# Patient Record
Sex: Female | Born: 1990 | Race: Black or African American | Hispanic: No | Marital: Single | Smoking: Current every day smoker
Health system: Southern US, Community
[De-identification: ages and names within clinical notes are randomized; demographics above are authoritative.]

## PROBLEM LIST (undated history)

## (undated) ENCOUNTER — Inpatient Hospital Stay (HOSPITAL_COMMUNITY): Payer: Self-pay

## (undated) DIAGNOSIS — B999 Unspecified infectious disease: Secondary | ICD-10-CM

## (undated) DIAGNOSIS — R519 Headache, unspecified: Secondary | ICD-10-CM

## (undated) DIAGNOSIS — R87629 Unspecified abnormal cytological findings in specimens from vagina: Secondary | ICD-10-CM

## (undated) DIAGNOSIS — A749 Chlamydial infection, unspecified: Secondary | ICD-10-CM

## (undated) DIAGNOSIS — B977 Papillomavirus as the cause of diseases classified elsewhere: Secondary | ICD-10-CM

## (undated) DIAGNOSIS — O139 Gestational [pregnancy-induced] hypertension without significant proteinuria, unspecified trimester: Secondary | ICD-10-CM

## (undated) DIAGNOSIS — R51 Headache: Secondary | ICD-10-CM

## (undated) DIAGNOSIS — F1290 Cannabis use, unspecified, uncomplicated: Secondary | ICD-10-CM

## (undated) DIAGNOSIS — F17200 Nicotine dependence, unspecified, uncomplicated: Secondary | ICD-10-CM

## (undated) DIAGNOSIS — A599 Trichomoniasis, unspecified: Secondary | ICD-10-CM

## (undated) DIAGNOSIS — N83209 Unspecified ovarian cyst, unspecified side: Secondary | ICD-10-CM

## (undated) DIAGNOSIS — B009 Herpesviral infection, unspecified: Secondary | ICD-10-CM

## (undated) HISTORY — PX: NO PAST SURGERIES: SHX2092

## (undated) HISTORY — PX: MOUTH SURGERY: SHX715

---
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
## 2009-02-01 ENCOUNTER — Emergency Department (HOSPITAL_COMMUNITY): Admission: EM | Admit: 2009-02-01 | Discharge: 2009-02-01 | Payer: Self-pay | Admitting: Emergency Medicine

## 2009-02-03 ENCOUNTER — Emergency Department (HOSPITAL_COMMUNITY): Admission: EM | Admit: 2009-02-03 | Discharge: 2009-02-03 | Payer: Self-pay | Admitting: Emergency Medicine

## 2009-04-07 ENCOUNTER — Emergency Department (HOSPITAL_COMMUNITY): Admission: EM | Admit: 2009-04-07 | Discharge: 2009-04-07 | Payer: Self-pay | Admitting: Family Medicine

## 2009-12-17 ENCOUNTER — Emergency Department (HOSPITAL_COMMUNITY): Admission: EM | Admit: 2009-12-17 | Discharge: 2009-12-17 | Payer: Self-pay | Admitting: Family Medicine

## 2010-07-17 ENCOUNTER — Inpatient Hospital Stay (INDEPENDENT_AMBULATORY_CARE_PROVIDER_SITE_OTHER)
Admission: RE | Admit: 2010-07-17 | Discharge: 2010-07-17 | Disposition: A | Discharge status: Home / Self Care | Payer: Self-pay | Source: Ambulatory Visit | Attending: Family Medicine | Admitting: Family Medicine

## 2010-07-17 DIAGNOSIS — A6000 Herpesviral infection of urogenital system, unspecified: Secondary | ICD-10-CM

## 2010-07-17 LAB — POCT URINALYSIS DIP (DEVICE)
Bilirubin Urine: NEGATIVE
Glucose, UA: NEGATIVE mg/dL
Ketones, ur: NEGATIVE mg/dL
Nitrite: NEGATIVE
Protein, ur: NEGATIVE mg/dL
Specific Gravity, Urine: 1.02 (ref 1.005–1.030)
Urobilinogen, UA: 0.2 mg/dL (ref 0.0–1.0)
pH: 7 (ref 5.0–8.0)

## 2010-07-17 LAB — POCT PREGNANCY, URINE: Preg Test, Ur: NEGATIVE

## 2010-07-18 LAB — STREP A DNA PROBE: Group A Strep Probe: NEGATIVE

## 2010-07-18 LAB — POCT RAPID STREP A (OFFICE): Streptococcus, Group A Screen (Direct): NEGATIVE

## 2010-07-19 ENCOUNTER — Inpatient Hospital Stay (HOSPITAL_COMMUNITY)
Admission: AD | Admit: 2010-07-19 | Discharge: 2010-07-19 | Disposition: A | Discharge status: Home / Self Care | Payer: Self-pay | Source: Ambulatory Visit | Attending: Obstetrics & Gynecology | Admitting: Obstetrics & Gynecology

## 2010-07-19 DIAGNOSIS — A6000 Herpesviral infection of urogenital system, unspecified: Secondary | ICD-10-CM

## 2010-07-19 DIAGNOSIS — N760 Acute vaginitis: Secondary | ICD-10-CM

## 2010-07-19 DIAGNOSIS — A499 Bacterial infection, unspecified: Secondary | ICD-10-CM | POA: Insufficient documentation

## 2010-07-19 DIAGNOSIS — N949 Unspecified condition associated with female genital organs and menstrual cycle: Secondary | ICD-10-CM | POA: Insufficient documentation

## 2010-07-19 DIAGNOSIS — B9689 Other specified bacterial agents as the cause of diseases classified elsewhere: Secondary | ICD-10-CM | POA: Insufficient documentation

## 2010-07-19 LAB — WET PREP, GENITAL
Trich, Wet Prep: NONE SEEN
Yeast Wet Prep HPF POC: NONE SEEN

## 2010-07-19 LAB — RPR: RPR Ser Ql: NONREACTIVE

## 2010-07-19 LAB — HEPATITIS B SURFACE ANTIGEN: Hepatitis B Surface Ag: NEGATIVE

## 2010-07-19 LAB — HIV ANTIBODY (ROUTINE TESTING W REFLEX): HIV: NONREACTIVE

## 2010-07-20 LAB — GC/CHLAMYDIA PROBE AMP, GENITAL
Chlamydia, DNA Probe: NEGATIVE
GC Probe Amp, Genital: NEGATIVE

## 2010-07-20 LAB — HSV 1 ANTIBODY, IGG: HSV 1 Glycoprotein G Ab, IgG: 0.13 IV

## 2010-07-20 LAB — HSV 2 ANTIBODY, IGG: HSV 2 Glycoprotein G Ab, IgG: 0.24 IV

## 2010-07-21 ENCOUNTER — Emergency Department (HOSPITAL_COMMUNITY)
Admission: EM | Admit: 2010-07-21 | Discharge: 2010-07-21 | Disposition: A | Discharge status: Home / Self Care | Payer: Self-pay | Attending: Emergency Medicine | Admitting: Emergency Medicine

## 2010-07-21 DIAGNOSIS — L02519 Cutaneous abscess of unspecified hand: Secondary | ICD-10-CM | POA: Insufficient documentation

## 2010-07-21 DIAGNOSIS — L03019 Cellulitis of unspecified finger: Secondary | ICD-10-CM | POA: Insufficient documentation

## 2010-07-21 DIAGNOSIS — Z79899 Other long term (current) drug therapy: Secondary | ICD-10-CM | POA: Insufficient documentation

## 2010-07-21 DIAGNOSIS — M79609 Pain in unspecified limb: Secondary | ICD-10-CM | POA: Insufficient documentation

## 2010-07-21 LAB — WET PREP, GENITAL: Clue Cells Wet Prep HPF POC: NONE SEEN

## 2010-07-21 LAB — POCT URINALYSIS DIP (DEVICE)
Glucose, UA: NEGATIVE mg/dL
Ketones, ur: NEGATIVE mg/dL
Nitrite: NEGATIVE
Protein, ur: NEGATIVE mg/dL
Specific Gravity, Urine: 1.025 (ref 1.005–1.030)
Urobilinogen, UA: 0.2 mg/dL (ref 0.0–1.0)
pH: 5 (ref 5.0–8.0)

## 2010-07-21 LAB — HERPES SIMPLEX VIRUS CULTURE: Culture: DETECTED

## 2010-07-21 LAB — GC/CHLAMYDIA PROBE AMP, GENITAL
Chlamydia, DNA Probe: NEGATIVE
GC Probe Amp, Genital: NEGATIVE

## 2010-07-21 LAB — POCT PREGNANCY, URINE: Preg Test, Ur: NEGATIVE

## 2010-07-27 LAB — POCT URINALYSIS DIP (DEVICE)
Bilirubin Urine: NEGATIVE
Glucose, UA: NEGATIVE mg/dL
Hgb urine dipstick: NEGATIVE
Ketones, ur: NEGATIVE mg/dL
Nitrite: NEGATIVE
Protein, ur: NEGATIVE mg/dL
Specific Gravity, Urine: 1.025 (ref 1.005–1.030)
Urobilinogen, UA: 0.2 mg/dL (ref 0.0–1.0)
pH: 5.5 (ref 5.0–8.0)

## 2010-07-27 LAB — GC/CHLAMYDIA PROBE AMP, GENITAL
Chlamydia, DNA Probe: NEGATIVE
GC Probe Amp, Genital: NEGATIVE

## 2010-07-27 LAB — WET PREP, GENITAL: Trich, Wet Prep: NONE SEEN

## 2010-07-27 LAB — POCT PREGNANCY, URINE: Preg Test, Ur: NEGATIVE

## 2010-08-29 ENCOUNTER — Inpatient Hospital Stay (INDEPENDENT_AMBULATORY_CARE_PROVIDER_SITE_OTHER)
Admission: RE | Admit: 2010-08-29 | Discharge: 2010-08-29 | Disposition: A | Discharge status: Home / Self Care | Payer: Self-pay | Source: Ambulatory Visit | Attending: Family Medicine | Admitting: Family Medicine

## 2010-08-29 DIAGNOSIS — A499 Bacterial infection, unspecified: Secondary | ICD-10-CM

## 2010-08-29 DIAGNOSIS — N760 Acute vaginitis: Secondary | ICD-10-CM

## 2010-08-29 DIAGNOSIS — B9689 Other specified bacterial agents as the cause of diseases classified elsewhere: Secondary | ICD-10-CM

## 2010-08-29 DIAGNOSIS — K602 Anal fissure, unspecified: Secondary | ICD-10-CM

## 2010-08-29 LAB — POCT URINALYSIS DIP (DEVICE)
Bilirubin Urine: NEGATIVE
Glucose, UA: NEGATIVE mg/dL
Ketones, ur: NEGATIVE mg/dL
Nitrite: NEGATIVE
Protein, ur: NEGATIVE mg/dL
Specific Gravity, Urine: 1.025 (ref 1.005–1.030)
Urobilinogen, UA: 0.2 mg/dL (ref 0.0–1.0)
pH: 5 (ref 5.0–8.0)

## 2010-08-29 LAB — POCT PREGNANCY, URINE: Preg Test, Ur: NEGATIVE

## 2010-09-02 NOTE — Group Therapy Note (Signed)
NAMEJACQUI, HEADEN NO.:  [id number]   MEDICAL RECORD NO.:  [id number]          PATIENT TYPE:  WOC   LOCATION:  [facility]                   FACILITY:  [facility]   PHYSICIAN:  Dorthula Perfect, MD     DATE OF BIRTH:  1991/01/19   DATE OF SERVICE:                                    CLINIC NOTE   Joanne Huff is a 20 year old black female, nulliparous, last menstrual  period September 5, comes because of a history of irregular menstrual  periods and the possibility of polycystic ovarian disease. She had menarche  at age 14. Beginning at about age 22, she stopped having periods and  thereafter had one period about every six months or so. Last month, [facility] the  [facility] department, she was started on Yaz which resulted in her having her  last period listed above. She is currently finishing up a second package.  Also, sometime in the past somebody prescribed the patch for her period,  she did not like that.   She is not sexually active.   REVIEW OF SYSTEMS:  No cardiovascular or respiratory complaints. She  recently had a little bit bright red blood having a bowel movement. It has  not happened again. She has no urinary symptoms.   FAMILY HISTORY:  Positive, in that she had a grandfather that has diabetes.  No immediate family members have diabetes.   PHYSICAL EXAMINATION:  VITAL SIGNS: Height 5'7, weight 248, blood pressure  129/85.  She has a slight amount of increased hair on her upper neck area under her  chin. She occasionally shaves this area.  BREASTS: Normal.  ABDOMEN: Obese. No masses were felt.  PELVIC: External genitalia and BUS glands are normal. Clitoris is of normal  size. Vaginal vault  is negative. Cervix is nulliparous, epithelized and  normal. Uterus is normal size and shape. Adnexal structures are normal. The  ovaries are difficult to feel because of her body habitus but I do not  detect that they are enlarged.  RECTAL: Negative.   IMPRESSION:   Irregular menstrual cycle, possible polycystic ovary syndrome.   DISPOSITION:  1. I have recommended that she remain on the Yaz, to regulate her periods.  2. Fasting blood sugar with two hour glucose tolerance test.  3. I have discussed a little bit with her the need to lose weight and gave      her some recommendations as to what to eat and not eat.          ______________________________  Dorthula Perfect, MD    ER/MEDQ  D:  01/12/2006  T:  01/14/2006  Job:  [id number]

## 2010-11-04 ENCOUNTER — Inpatient Hospital Stay (INDEPENDENT_AMBULATORY_CARE_PROVIDER_SITE_OTHER)
Admission: RE | Admit: 2010-11-04 | Discharge: 2010-11-04 | Disposition: A | Discharge status: Home / Self Care | Payer: Self-pay | Source: Ambulatory Visit | Attending: Family Medicine | Admitting: Family Medicine

## 2010-11-04 DIAGNOSIS — A6000 Herpesviral infection of urogenital system, unspecified: Secondary | ICD-10-CM

## 2010-11-04 LAB — POCT URINALYSIS DIP (DEVICE)
Bilirubin Urine: NEGATIVE
Glucose, UA: NEGATIVE mg/dL
Hgb urine dipstick: NEGATIVE
Ketones, ur: NEGATIVE mg/dL
Nitrite: NEGATIVE
Protein, ur: NEGATIVE mg/dL
Specific Gravity, Urine: >=1.03 (ref 1.005–1.030)
Urobilinogen, UA: 0.2 mg/dL (ref 0.0–1.0)
pH: 5.5 (ref 5.0–8.0)

## 2010-11-04 LAB — POCT PREGNANCY, URINE: Preg Test, Ur: NEGATIVE

## 2011-01-05 LAB — URINALYSIS, ROUTINE W REFLEX MICROSCOPIC
Bilirubin Urine: NEGATIVE
Glucose, UA: NEGATIVE
Hgb urine dipstick: NEGATIVE
Ketones, ur: NEGATIVE
Nitrite: NEGATIVE
Protein, ur: NEGATIVE
Specific Gravity, Urine: 1.02
Urobilinogen, UA: 0.2
pH: 7

## 2011-01-05 LAB — WET PREP, GENITAL
Clue Cells Wet Prep HPF POC: NONE SEEN
Trich, Wet Prep: NONE SEEN
Yeast Wet Prep HPF POC: NONE SEEN

## 2011-01-05 LAB — GC/CHLAMYDIA PROBE AMP, GENITAL
Chlamydia, DNA Probe: NEGATIVE
GC Probe Amp, Genital: NEGATIVE

## 2011-01-05 LAB — POCT PREGNANCY, URINE
Operator id: 280671
Preg Test, Ur: NEGATIVE

## 2011-01-10 LAB — RAPID STREP SCREEN (MED CTR MEBANE ONLY): Streptococcus, Group A Screen (Direct): NEGATIVE

## 2011-01-10 LAB — POCT PREGNANCY, URINE
Operator id: 202651
Preg Test, Ur: NEGATIVE

## 2011-01-13 LAB — URINALYSIS, ROUTINE W REFLEX MICROSCOPIC
Bilirubin Urine: NEGATIVE
Glucose, UA: NEGATIVE
Ketones, ur: NEGATIVE
Nitrite: NEGATIVE
Protein, ur: NEGATIVE
Specific Gravity, Urine: 1.025
Urobilinogen, UA: 0.2
pH: 6

## 2011-01-13 LAB — CBC
HCT: 39
Hemoglobin: 12.8
MCHC: 32.7
MCV: 86.5
Platelets: 331
RBC: 4.51
RDW: 15.7 — ABNORMAL HIGH
WBC: 6.1

## 2011-01-13 LAB — POCT PREGNANCY, URINE
Operator id: 220991
Preg Test, Ur: NEGATIVE

## 2011-01-13 LAB — URINE MICROSCOPIC-ADD ON

## 2011-01-13 LAB — GC/CHLAMYDIA PROBE AMP, GENITAL
Chlamydia, DNA Probe: NEGATIVE
GC Probe Amp, Genital: POSITIVE — AB

## 2011-01-13 LAB — WET PREP, GENITAL
Clue Cells Wet Prep HPF POC: NONE SEEN
Trich, Wet Prep: NONE SEEN
Yeast Wet Prep HPF POC: NONE SEEN

## 2011-01-16 LAB — POCT URINALYSIS DIP (DEVICE)
Glucose, UA: NEGATIVE
Hgb urine dipstick: NEGATIVE
Ketones, ur: NEGATIVE
Nitrite: NEGATIVE
Operator id: 239701
Protein, ur: NEGATIVE
Specific Gravity, Urine: 1.015
Urobilinogen, UA: 1
pH: 5.5

## 2011-01-16 LAB — WET PREP, GENITAL
Trich, Wet Prep: NONE SEEN
Yeast Wet Prep HPF POC: NONE SEEN

## 2011-01-16 LAB — POCT PREGNANCY, URINE: Preg Test, Ur: NEGATIVE

## 2011-01-16 LAB — GC/CHLAMYDIA PROBE AMP, GENITAL
Chlamydia, DNA Probe: NEGATIVE
GC Probe Amp, Genital: NEGATIVE

## 2011-01-20 LAB — RAPID STREP SCREEN (MED CTR MEBANE ONLY): Streptococcus, Group A Screen (Direct): POSITIVE — AB

## 2011-01-25 LAB — URINALYSIS, ROUTINE W REFLEX MICROSCOPIC
Bilirubin Urine: NEGATIVE
Glucose, UA: NEGATIVE
Hgb urine dipstick: NEGATIVE
Ketones, ur: NEGATIVE
Nitrite: NEGATIVE
Protein, ur: NEGATIVE
Specific Gravity, Urine: 1.025
Urobilinogen, UA: 0.2
pH: 6.5

## 2011-01-25 LAB — POCT URINALYSIS DIP (DEVICE)
Bilirubin Urine: NEGATIVE
Glucose, UA: NEGATIVE
Hgb urine dipstick: NEGATIVE
Ketones, ur: NEGATIVE
Nitrite: NEGATIVE
Operator id: 247071
Protein, ur: NEGATIVE
Specific Gravity, Urine: 1.02
Urobilinogen, UA: 1
pH: 7

## 2011-01-25 LAB — POCT PREGNANCY, URINE
Operator id: 220991
Operator id: 247071
Preg Test, Ur: NEGATIVE
Preg Test, Ur: NEGATIVE

## 2011-01-25 LAB — GC/CHLAMYDIA PROBE AMP, GENITAL
Chlamydia, DNA Probe: NEGATIVE
GC Probe Amp, Genital: NEGATIVE

## 2011-01-25 LAB — WET PREP, GENITAL
Clue Cells Wet Prep HPF POC: NONE SEEN
Trich, Wet Prep: NONE SEEN
Yeast Wet Prep HPF POC: NONE SEEN

## 2011-02-20 ENCOUNTER — Encounter: Payer: Self-pay | Admitting: *Deleted

## 2011-02-20 ENCOUNTER — Emergency Department (HOSPITAL_COMMUNITY)
Admission: EM | Admit: 2011-02-20 | Discharge: 2011-02-21 | Discharge status: Against Medical Advice | Payer: Self-pay | Attending: Emergency Medicine | Admitting: Emergency Medicine

## 2011-02-20 DIAGNOSIS — R070 Pain in throat: Secondary | ICD-10-CM | POA: Insufficient documentation

## 2011-02-20 NOTE — ED Notes (Signed)
sorethroat for 4 days and some lt ear pain also

## 2011-02-21 NOTE — ED Notes (Signed)
Called pt, no answer.

## 2011-03-08 ENCOUNTER — Other Ambulatory Visit: Payer: Self-pay | Admitting: Family Medicine

## 2011-03-08 DIAGNOSIS — N6310 Unspecified lump in the right breast, unspecified quadrant: Secondary | ICD-10-CM

## 2011-03-15 ENCOUNTER — Other Ambulatory Visit: Payer: Self-pay

## 2011-05-10 ENCOUNTER — Encounter: Payer: Self-pay | Admitting: Obstetrics & Gynecology

## 2012-03-06 ENCOUNTER — Encounter (HOSPITAL_COMMUNITY): Payer: Self-pay

## 2012-03-06 ENCOUNTER — Inpatient Hospital Stay (HOSPITAL_COMMUNITY)
Admission: AD | Admit: 2012-03-06 | Discharge: 2012-03-06 | Disposition: A | Discharge status: Home / Self Care | Payer: Self-pay | Source: Ambulatory Visit | Attending: Obstetrics & Gynecology | Admitting: Obstetrics & Gynecology

## 2012-03-06 DIAGNOSIS — N912 Amenorrhea, unspecified: Secondary | ICD-10-CM | POA: Insufficient documentation

## 2012-03-06 DIAGNOSIS — Z3202 Encounter for pregnancy test, result negative: Secondary | ICD-10-CM | POA: Insufficient documentation

## 2012-03-06 DIAGNOSIS — R110 Nausea: Secondary | ICD-10-CM | POA: Insufficient documentation

## 2012-03-06 HISTORY — DX: Herpesviral infection, unspecified: B00.9

## 2012-03-06 LAB — URINALYSIS, ROUTINE W REFLEX MICROSCOPIC
Bilirubin Urine: NEGATIVE
Glucose, UA: NEGATIVE mg/dL
Hgb urine dipstick: NEGATIVE
Ketones, ur: NEGATIVE mg/dL
Leukocytes, UA: NEGATIVE
Nitrite: NEGATIVE
Protein, ur: NEGATIVE mg/dL
Specific Gravity, Urine: 1.02 (ref 1.005–1.030)
Urobilinogen, UA: 0.2 mg/dL (ref 0.0–1.0)
pH: 7.5 (ref 5.0–8.0)

## 2012-03-06 LAB — POCT PREGNANCY, URINE: Preg Test, Ur: NEGATIVE

## 2012-03-06 NOTE — MAU Provider Note (Signed)
  History     CSN: [id number]  Arrival date and time: 03/06/12 7564   None     Chief Complaint  Patient presents with  . Nausea  . Amenorrhea   HPI 21 y.o. G0 with nausea x 4 days, no vomiting, period is a few days late. Patient's last menstrual period was 01/31/2012.   Past Medical History  Diagnosis Date  . HSV-1 infection     History reviewed. No pertinent past surgical history.  History reviewed. No pertinent family history.  History  Substance Use Topics  . Smoking status: Current Every Day Smoker  . Smokeless tobacco: Not on file  . Alcohol Use: No    Allergies: No Known Allergies  No prescriptions prior to admission    Review of Systems  Constitutional: Negative.   Respiratory: Negative.   Cardiovascular: Negative.   Gastrointestinal: Positive for nausea. Negative for vomiting, abdominal pain, diarrhea and constipation.  Genitourinary: Negative for dysuria, urgency, frequency, hematuria and flank pain.       Negative for vaginal bleeding, vaginal discharge, dyspareunia  Musculoskeletal: Negative.   Neurological: Negative.   Psychiatric/Behavioral: Negative.    Physical Exam   Blood pressure 118/64, pulse 53, temperature 97.3 F (36.3 C), temperature source Oral, resp. rate 18, height 5\' 9"  (1.753 m), weight 208 lb (94.348 kg), last menstrual period 01/31/2012.  Physical Exam  Nursing note and vitals reviewed. Constitutional: She is oriented to person, place, and time. She appears well-developed and well-nourished. No distress.  Cardiovascular: Normal rate.   Respiratory: Effort normal.  GI: Soft. There is no tenderness.  Musculoskeletal: Normal range of motion.  Neurological: She is alert and oriented to person, place, and time.  Skin: Skin is warm and dry.  Psychiatric: She has a normal mood and affect.    MAU Course  Procedures  Results for orders placed during the hospital encounter of 03/06/12 (from the past 24 hour(s))  POCT  PREGNANCY, URINE     Status: Normal   Collection Time   03/06/12  9:53 AM      Component Value Range   Preg Test, Ur NEGATIVE  NEGATIVE     Assessment and Plan   1. Negative pregnancy test   Repeat UPT if no period in 1-2 weeks, if not pregnant and no period x 3 months, f/u outpatient    Medication List     As of 03/06/2012 10:18 AM    CONTINUE taking these medications         multivitamin with minerals Tabs      valACYclovir 500 MG tablet   Commonly known as: VALTREX            Follow-up Information    Please follow up. (As needed)            Sianni Cloninger 03/06/2012, 10:07 AM

## 2012-06-14 ENCOUNTER — Emergency Department (HOSPITAL_COMMUNITY)
Admission: EM | Admit: 2012-06-14 | Discharge: 2012-06-14 | Discharge status: Against Medical Advice | Payer: Self-pay | Source: Home / Self Care

## 2012-06-14 NOTE — ED Provider Notes (Signed)
History     CSN: [id number]  Arrival date & time 06/14/12  1116   First MD Initiated Contact with Patient 06/14/12 1220      No chief complaint on file.   (Consider location/radiation/quality/duration/timing/severity/associated sxs/prior treatment) HPI  Past Medical History  Diagnosis Date  . HSV-1 infection     No past surgical history on file.  No family history on file.  History  Substance Use Topics  . Smoking status: Current Every Day Smoker  . Smokeless tobacco: Not on file  . Alcohol Use: No    OB History   Grav Para Term Preterm Abortions TAB SAB Ect Mult Living   0               Review of Systems  Allergies  Review of patient's allergies indicates no known allergies.  Home Medications   Current Outpatient Rx  Name  Route  Sig  Dispense  Refill  . Multiple Vitamin (MULTIVITAMIN WITH MINERALS) TABS   Oral   Take 1 tablet by mouth daily.         . valACYclovir (VALTREX) 500 MG tablet   Oral   Take 500 mg by mouth daily as needed. For " flare-ups"           There were no vitals taken for this visit.  Physical Exam  ED Course  Procedures (including critical care time)  Labs Reviewed - No data to display No results found.   No diagnosis found.    MDM  Pt left before being seen        Hayden Rasmussen, NP 06/14/12 1932

## 2012-06-14 NOTE — ED Notes (Signed)
Called, no answer.

## 2012-06-14 NOTE — ED Notes (Signed)
NO ANSWER WHEN CALLED TO COME BACK

## 2012-06-15 NOTE — ED Provider Notes (Signed)
Medical screening examination/treatment/procedure(s) were performed by resident physician or non-physician practitioner and as supervising physician I was immediately available for consultation/collaboration.   KINDL,JAMES DOUGLAS MD.   James D Kindl, MD 06/15/12 1116 

## 2013-01-20 ENCOUNTER — Emergency Department (INDEPENDENT_AMBULATORY_CARE_PROVIDER_SITE_OTHER)
Admission: EM | Admit: 2013-01-20 | Discharge: 2013-01-20 | Disposition: A | Discharge status: Home / Self Care | Payer: Self-pay | Source: Home / Self Care

## 2013-01-20 ENCOUNTER — Encounter (HOSPITAL_COMMUNITY): Payer: Self-pay | Admitting: Emergency Medicine

## 2013-01-20 DIAGNOSIS — J020 Streptococcal pharyngitis: Secondary | ICD-10-CM

## 2013-01-20 LAB — POCT RAPID STREP A: Streptococcus, Group A Screen (Direct): POSITIVE — AB

## 2013-01-20 MED ORDER — AMOXICILLIN 875 MG PO TABS: 875.0000 mg | ORAL_TABLET | Freq: Two times a day (BID) | ORAL | Status: DC

## 2013-01-20 NOTE — ED Notes (Signed)
Patient c/o sore throat, fever, blood in phlegm , and onset of symptoms 2 days ago

## 2013-01-20 NOTE — ED Provider Notes (Signed)
CSN: [id number]     Arrival date & time 01/20/13  1228 History   None    Chief Complaint  Patient presents with  . Sore Throat   (Consider location/radiation/quality/duration/timing/severity/associated sxs/prior Treatment) Patient is a 22 y.o. female presenting with pharyngitis. The history is provided by the patient.  Sore Throat This is a new problem. The current episode started 2 days ago. The problem occurs constantly. The problem has been gradually worsening. The symptoms are aggravated by swallowing. Nothing relieves the symptoms. Treatments tried: ibuprofen. The treatment provided no relief.    Past Medical History  Diagnosis Date  . HSV-1 infection    History reviewed. No pertinent past surgical history. History reviewed. No pertinent family history. History  Substance Use Topics  . Smoking status: Current Every Day Smoker  . Smokeless tobacco: Not on file  . Alcohol Use: No   OB History   Grav Para Term Preterm Abortions TAB SAB Ect Mult Living   0              Review of Systems  Constitutional: Positive for fever and chills.  HENT: Positive for sore throat. Negative for congestion.   Respiratory: Negative for cough.     Allergies  Review of patient's allergies indicates no known allergies.  Home Medications   Current Outpatient Rx  Name  Route  Sig  Dispense  Refill  . IBUPROFEN PO   Oral   Take by mouth.         Marland Kitchen amoxicillin (AMOXIL) 875 MG tablet   Oral   Take 1 tablet (875 mg total) by mouth 2 (two) times daily.   20 tablet   0   . Multiple Vitamin (MULTIVITAMIN WITH MINERALS) TABS   Oral   Take 1 tablet by mouth daily.         . valACYclovir (VALTREX) 500 MG tablet   Oral   Take 500 mg by mouth daily as needed. For " flare-ups"          BP 128/82  Pulse 72  Temp(Src) 98.2 F (36.8 C) (Oral)  Resp 16  SpO2 99%  LMP 12/25/2012 Physical Exam  Constitutional: She appears well-developed and well-nourished. She appears ill.  HENT:   Right Ear: Tympanic membrane, external ear and ear canal normal.  Left Ear: Tympanic membrane, external ear and ear canal normal.  Nose: No mucosal edema.  Mouth/Throat: Mucous membranes are normal. Oropharyngeal exudate, posterior oropharyngeal edema and posterior oropharyngeal erythema present.  Cardiovascular: Normal rate and regular rhythm.   Pulmonary/Chest: Effort normal and breath sounds normal.  Lymphadenopathy:       Head (right side): Submandibular adenopathy present.       Head (left side): Submandibular adenopathy present.    ED Course  Procedures (including critical care time) Labs Review Labs Reviewed  POCT RAPID STREP A ([facility] ONLY) - Abnormal; Notable for the following:    Streptococcus, Group A Screen (Direct) POSITIVE (*)    All other components within normal limits   Imaging Review No results found.  MDM   1. Strep pharyngitis   rx amoxicillin 875mg  BID #20.      Cathlyn Parsons, NP 01/20/13 1414

## 2013-01-22 NOTE — ED Provider Notes (Signed)
Medical screening examination/treatment/procedure(s) were performed by a resident physician or non-physician practitioner and as the supervising physician I was immediately available for consultation/collaboration.  Clementeen Graham, MD    Rodolph Bong, MD 01/22/13 [id number]

## 2014-01-18 ENCOUNTER — Inpatient Hospital Stay (HOSPITAL_COMMUNITY)
Admission: AD | Admit: 2014-01-18 | Discharge: 2014-01-18 | Disposition: A | Discharge status: Home / Self Care | Payer: 59 | Source: Ambulatory Visit | Attending: Obstetrics and Gynecology | Admitting: Obstetrics and Gynecology

## 2014-01-18 ENCOUNTER — Encounter (HOSPITAL_COMMUNITY): Payer: Self-pay | Admitting: *Deleted

## 2014-01-18 ENCOUNTER — Inpatient Hospital Stay (HOSPITAL_COMMUNITY): Payer: 59

## 2014-01-18 DIAGNOSIS — O26899 Other specified pregnancy related conditions, unspecified trimester: Secondary | ICD-10-CM

## 2014-01-18 DIAGNOSIS — R109 Unspecified abdominal pain: Secondary | ICD-10-CM

## 2014-01-18 DIAGNOSIS — A499 Bacterial infection, unspecified: Secondary | ICD-10-CM

## 2014-01-18 DIAGNOSIS — B9689 Other specified bacterial agents as the cause of diseases classified elsewhere: Secondary | ICD-10-CM

## 2014-01-18 DIAGNOSIS — O23591 Infection of other part of genital tract in pregnancy, first trimester: Secondary | ICD-10-CM

## 2014-01-18 DIAGNOSIS — N760 Acute vaginitis: Secondary | ICD-10-CM

## 2014-01-18 DIAGNOSIS — O9989 Other specified diseases and conditions complicating pregnancy, childbirth and the puerperium: Secondary | ICD-10-CM

## 2014-01-18 DIAGNOSIS — Z3A01 Less than 8 weeks gestation of pregnancy: Secondary | ICD-10-CM | POA: Insufficient documentation

## 2014-01-18 DIAGNOSIS — O99331 Smoking (tobacco) complicating pregnancy, first trimester: Secondary | ICD-10-CM | POA: Insufficient documentation

## 2014-01-18 LAB — CBC
HCT: 38.6 % (ref 36.0–46.0)
Hemoglobin: 13.1 g/dL (ref 12.0–15.0)
MCH: 30.1 pg (ref 26.0–34.0)
MCHC: 33.9 g/dL (ref 30.0–36.0)
MCV: 88.7 fL (ref 78.0–100.0)
Platelets: 284 10*3/uL (ref 150–400)
RBC: 4.35 MIL/uL (ref 3.87–5.11)
RDW: 13.7 % (ref 11.5–15.5)
WBC: 6.5 10*3/uL (ref 4.0–10.5)

## 2014-01-18 LAB — URINALYSIS, ROUTINE W REFLEX MICROSCOPIC
Bilirubin Urine: NEGATIVE
Glucose, UA: NEGATIVE mg/dL
Hgb urine dipstick: NEGATIVE
Ketones, ur: NEGATIVE mg/dL
Leukocytes, UA: NEGATIVE
Nitrite: NEGATIVE
Protein, ur: NEGATIVE mg/dL
Specific Gravity, Urine: 1.015 (ref 1.005–1.030)
Urobilinogen, UA: 0.2 mg/dL (ref 0.0–1.0)
pH: 7.5 (ref 5.0–8.0)

## 2014-01-18 LAB — WET PREP, GENITAL
Trich, Wet Prep: NONE SEEN
Yeast Wet Prep HPF POC: NONE SEEN

## 2014-01-18 LAB — POCT PREGNANCY, URINE: Preg Test, Ur: POSITIVE — AB

## 2014-01-18 LAB — HIV ANTIBODY (ROUTINE TESTING W REFLEX): HIV 1&2 Ab, 4th Generation: NONREACTIVE

## 2014-01-18 LAB — HCG, QUANTITATIVE, PREGNANCY: hCG, Beta Chain, Quant, S: 13291 m[IU]/mL — ABNORMAL HIGH (ref <5)

## 2014-01-18 LAB — ABO/RH: ABO/RH(D): A POS

## 2014-01-18 MED ORDER — VALACYCLOVIR HCL 500 MG PO TABS
500.0000 mg | ORAL_TABLET | Freq: Two times a day (BID) | ORAL | Status: DC
Start: 2014-01-18 — End: 2014-02-03

## 2014-01-18 MED ORDER — METRONIDAZOLE 500 MG PO TABS: 500.0000 mg | ORAL_TABLET | Freq: Two times a day (BID) | ORAL | Status: DC

## 2014-01-18 NOTE — MAU Provider Note (Signed)
History     CSN: [id number]  Arrival date and time: 01/18/14 1159   First Provider Initiated Contact with Patient 01/18/14 1314      Chief Complaint  Patient presents with  . Possible Pregnancy   HPI Joanne Huff 23 y.o. G1P0 @[redacted]w[redacted]d  presents to MAU with complaints of abdominal pain that started 2 days ago.  Initially it was a dull pain but has increased to 5-6/10.  She took HPT yesterday and the result was positive.  She did notice she was late for her menstrual cycle.  She typically lifts heavy objects for her job and at home but has not recently done any activity out of the ordinary.  She is also concerned about using alcohol at an event a couple weeks ago.  She is a smoker up to 1/2 ppd but has not smoked in 24 hours.  She denies vaginal bleeding, dysuria, fever.    A couple of weeks ago she had HSV outbreak but no symptoms at present.   OB History   Grav Para Term Preterm Abortions TAB SAB Ect Mult Living   1               Past Medical History  Diagnosis Date  . HSV-1 infection     History reviewed. No pertinent past surgical history.  History reviewed. No pertinent family history.  History  Substance Use Topics  . Smoking status: Current Every Day Smoker  . Smokeless tobacco: Not on file  . Alcohol Use: No    Allergies: No Known Allergies  Prescriptions prior to admission  Medication Sig Dispense Refill  . amoxicillin (AMOXIL) 875 MG tablet Take 1 tablet (875 mg total) by mouth 2 (two) times daily.  20 tablet  0  . IBUPROFEN PO Take by mouth.      . Multiple Vitamin (MULTIVITAMIN WITH MINERALS) TABS Take 1 tablet by mouth daily.      . valACYclovir (VALTREX) 500 MG tablet Take 500 mg by mouth daily as needed. For " flare-ups"        Review of Systems  Constitutional: Negative for fever, chills and diaphoresis.  HENT: Negative for congestion and sore throat.   Eyes: Negative for blurred vision and double vision.  Respiratory: Positive for shortness of breath.  Negative for cough and wheezing.   Cardiovascular: Negative for chest pain and palpitations.  Gastrointestinal: Positive for heartburn, nausea, vomiting, abdominal pain and constipation. Negative for diarrhea.  Genitourinary: Negative for dysuria, frequency and hematuria.  Musculoskeletal: Negative for back pain and neck pain.  Skin: Positive for itching. Negative for rash.  Neurological: Negative for dizziness, tingling, weakness and headaches.  Psychiatric/Behavioral: Negative for depression, suicidal ideas and substance abuse. The patient is not nervous/anxious.    Physical Exam   Blood pressure 136/70, pulse 79, temperature 98.4 F (36.9 C), resp. rate 20, last menstrual period 12/05/2013.  Physical Exam  Constitutional: She is oriented to person, place, and time. She appears well-developed and well-nourished.  HENT:  Head: Normocephalic and atraumatic.  Eyes: EOM are normal.  Neck: Normal range of motion.  Cardiovascular: Normal rate and regular rhythm.   Respiratory: Effort normal and breath sounds normal. No respiratory distress.  GI: Soft. Bowel sounds are normal. She exhibits no distension. There is tenderness.  Diffuse tenderness across lower abdomen  Genitourinary:  Moderate amt of homogenous, malodorous, cream colored discharge.   Cervix smooth, red, closed.  No CMT/adnexal mass or tenderness  Musculoskeletal: Normal range of motion.  Neurological: She  is alert and oriented to person, place, and time.  Skin: Skin is warm and dry.  Psychiatric: She has a normal mood and affect.  No results found.  Results for orders placed during the hospital encounter of 01/18/14 (from the past 48 hour(s))  URINALYSIS, ROUTINE W REFLEX MICROSCOPIC     Status: None   Collection Time    01/18/14 12:20 PM      Result Value Ref Range   Color, Urine YELLOW  YELLOW   APPearance CLEAR  CLEAR   Specific Gravity, Urine 1.015  1.005 - 1.030   pH 7.5  5.0 - 8.0   Glucose, UA NEGATIVE   NEGATIVE mg/dL   Hgb urine dipstick NEGATIVE  NEGATIVE   Bilirubin Urine NEGATIVE  NEGATIVE   Ketones, ur NEGATIVE  NEGATIVE mg/dL   Protein, ur NEGATIVE  NEGATIVE mg/dL   Urobilinogen, UA 0.2  0.0 - 1.0 mg/dL   Nitrite NEGATIVE  NEGATIVE   Leukocytes, UA NEGATIVE  NEGATIVE   Comment: MICROSCOPIC NOT DONE ON URINES WITH NEGATIVE PROTEIN, BLOOD, LEUKOCYTES, NITRITE, OR GLUCOSE <1000 mg/dL.  POCT PREGNANCY, URINE     Status: Abnormal   Collection Time    01/18/14 12:27 PM      Result Value Ref Range   Preg Test, Ur POSITIVE (*) NEGATIVE   Comment:            THE SENSITIVITY OF THIS     METHODOLOGY IS >24 mIU/mL  WET PREP, GENITAL     Status: Abnormal   Collection Time    01/18/14  1:30 PM      Result Value Ref Range   Yeast Wet Prep HPF POC NONE SEEN  NONE SEEN   Trich, Wet Prep NONE SEEN  NONE SEEN   Clue Cells Wet Prep HPF POC MODERATE (*) NONE SEEN   WBC, Wet Prep HPF POC FEW (*) NONE SEEN   Comment: MANY BACTERIA SEEN  CBC     Status: None   Collection Time    01/18/14  1:55 PM      Result Value Ref Range   WBC 6.5  4.0 - 10.5 K/uL   RBC 4.35  3.87 - 5.11 MIL/uL   Hemoglobin 13.1  12.0 - 15.0 g/dL   HCT 16.1  09.6 - 04.5 %   MCV 88.7  78.0 - 100.0 fL   MCH 30.1  26.0 - 34.0 pg   MCHC 33.9  30.0 - 36.0 g/dL   RDW 40.9  81.1 - 91.4 %   Platelets 284  150 - 400 K/uL  ABO/RH     Status: None   Collection Time    01/18/14  1:55 PM      Result Value Ref Range   ABO/RH(D) A POS    HCG, QUANTITATIVE, PREGNANCY     Status: Abnormal   Collection Time    01/18/14  1:55 PM      Result Value Ref Range   hCG, Beta Chain, Quant, S 13291 (*) <5 mIU/mL   Comment:              GEST. AGE      CONC.  (mIU/mL)       <=1 WEEK        5 - 50         2 WEEKS       50 - 500         3 WEEKS       100 - 10,000  4 WEEKS     1,000 - 30,000         5 WEEKS     3,500 - 115,000       6-8 WEEKS     12,000 - 270,000        12 WEEKS     15,000 - 220,000                FEMALE AND  NON-PREGNANT FEMALE:         LESS THAN 5 mIU/mL   US Ob Comp Less 14 Wks  01/18/2014   CLINICAL DATA:  Lifted heavy box.  Pain. Initial encounter.  EXAM: OBSTETRIC <14 WK [place] AND TRANSVAGINAL OB US  TECHNIQUE: Both transabdominal and transvaginal ultrasound examinations were performed for complete evaluation of the gestation as well as the maternal uterus, adnexal regions, and pelvic cul-de-sac. Transvaginal technique was performed to assess early pregnancy.  COMPARISON:  Pelvic ultrasound 07/02/2006  FINDINGS: Intrauterine gestational sac: Visualized/normal in shape.  Yolk sac:  Present  Embryo:  Present  Cardiac Activity: Present  Heart Rate:  120 bpm  CRL:   5  mm   6 w 2 d                  [place] EDC: 09/11/2014  Maternal uterus/adnexae: Possible trace subchorionic hemorrhage anteriorly on image 26. Right ovarian corpus luteal cyst. Trace cul-de-sac fluid which is likely physiologic.  IMPRESSION: 1. Intrauterine pregnancy of approximately 6 weeks 2 days with fetal heart rate of 120 beats per min. 2. Possible trace subchorionic hemorrhage. 3. Right ovarian corpus luteal cyst.   Electronically Signed   By: Jeronimo Greaves M.D.   On: 01/18/2014 16:38   US Ob Transvaginal  01/18/2014   CLINICAL DATA:  Lifted heavy box.  Pain. Initial encounter.  EXAM: OBSTETRIC <14 WK [place] AND TRANSVAGINAL OB US  TECHNIQUE: Both transabdominal and transvaginal ultrasound examinations were performed for complete evaluation of the gestation as well as the maternal uterus, adnexal regions, and pelvic cul-de-sac. Transvaginal technique was performed to assess early pregnancy.  COMPARISON:  Pelvic ultrasound 07/02/2006  FINDINGS: Intrauterine gestational sac: Visualized/normal in shape.  Yolk sac:  Present  Embryo:  Present  Cardiac Activity: Present  Heart Rate:  120 bpm  CRL:   5  mm   6 w 2 d                  [place] EDC: 09/11/2014  Maternal uterus/adnexae: Possible trace subchorionic hemorrhage anteriorly on image 26. Right ovarian corpus  luteal cyst. Trace cul-de-sac fluid which is likely physiologic.  IMPRESSION: 1. Intrauterine pregnancy of approximately 6 weeks 2 days with fetal heart rate of 120 beats per min. 2. Possible trace subchorionic hemorrhage. 3. Right ovarian corpus luteal cyst.   Electronically Signed   By: Jeronimo Greaves M.D.   On: 01/18/2014 16:38   MAU Course  Procedures  MDM Wet prep suggestive of bacterial vaginosis.  U/A is negative.   U/S shows IUP with cardiac activity of 120bpm at [redacted]w[redacted]d  Assessment and Plan  A: Abdominal pain in pregnancy IUP with possible subchorioic hemorrhage Bacterial vaginosis  P: Discharge to home Pelvic rest Flagyl 500mg  bid x 1 week No etoh/IC x 7 days Obtain [facility] asap - resources given Refill given on Valtrex per pt request. Return to MAU for emergency  Bertram Denver 01/18/2014, 1:17 PM

## 2014-01-18 NOTE — Discharge Instructions (Signed)
Abdominal Pain During Pregnancy Abdominal pain is common in pregnancy. Most of the time, it does not cause harm. There are many causes of abdominal pain. Some causes are more serious than others. Some of the causes of abdominal pain in pregnancy are easily diagnosed. Occasionally, the diagnosis takes time to understand. Other times, the cause is not determined. Abdominal pain can be a sign that something is very wrong with the pregnancy, or the pain may have nothing to do with the pregnancy at all. For this reason, always tell your health care provider if you have any abdominal discomfort. HOME CARE INSTRUCTIONS  Monitor your abdominal pain for any changes. The following actions may help to alleviate any discomfort you are experiencing:  Do not have sexual intercourse or put anything in your vagina until your symptoms go away completely.  Get plenty of rest until your pain improves.  Drink clear fluids if you feel nauseous. Avoid solid food as long as you are uncomfortable or nauseous.  Only take over-the-counter or prescription medicine as directed by your health care provider.  Keep all follow-up appointments with your health care provider. SEEK IMMEDIATE MEDICAL CARE IF:  You are bleeding, leaking fluid, or passing tissue from the vagina.  You have increasing pain or cramping.  You have persistent vomiting.  You have painful or bloody urination.  You have a fever.  You notice a decrease in your baby's movements.  You have extreme weakness or feel faint.  You have shortness of breath, with or without abdominal pain.  You develop a severe headache with abdominal pain.  You have abnormal vaginal discharge with abdominal pain.  You have persistent diarrhea.  You have abdominal pain that continues even after rest, or gets worse. MAKE SURE YOU:   Understand these instructions.  Will watch your condition.  Will get help right away if you are not doing well or get  worse. Document Released: 04/03/2005 Document Revised: 01/22/2013 Document Reviewed: 10/31/2012 [facility] Patient Information 2015 [place], [place]. This information is not intended to replace advice given to you by your health care provider. Make sure you discuss any questions you have with your health care provider. Bacterial Vaginosis Bacterial vaginosis is a vaginal infection that occurs when the normal balance of bacteria in the vagina is disrupted. It results from an overgrowth of certain bacteria. This is the most common vaginal infection in women of childbearing age. Treatment is important to prevent complications, especially in pregnant women, as it can cause a premature delivery. CAUSES  Bacterial vaginosis is caused by an increase in harmful bacteria that are normally present in smaller amounts in the vagina. Several different kinds of bacteria can cause bacterial vaginosis. However, the reason that the condition develops is not fully understood. RISK FACTORS Certain activities or behaviors can put you at an increased risk of developing bacterial vaginosis, including:  Having a new sex partner or multiple sex partners.  Douching.  Using an intrauterine device (IUD) for contraception. Women do not get bacterial vaginosis from toilet seats, bedding, swimming pools, or contact with objects around them. SIGNS AND SYMPTOMS  Some women with bacterial vaginosis have no signs or symptoms. Common symptoms include:  Grey vaginal discharge.  A fishlike odor with discharge, especially after sexual intercourse.  Itching or burning of the vagina and vulva.  Burning or pain with urination. DIAGNOSIS  Your health care provider will take a medical history and examine the vagina for signs of bacterial vaginosis. A sample of vaginal fluid may  be taken. Your health care provider will look at this sample under a microscope to check for bacteria and abnormal cells. A vaginal pH test may also be done.   TREATMENT  Bacterial vaginosis may be treated with antibiotic medicines. These may be given in the form of a pill or a vaginal cream. A second round of antibiotics may be prescribed if the condition comes back after treatment.  HOME CARE INSTRUCTIONS   Only take over-the-counter or prescription medicines as directed by your health care provider.  If antibiotic medicine was prescribed, take it as directed. Make sure you finish it even if you start to feel better.  Do not have sex until treatment is completed.  Tell all sexual partners that you have a vaginal infection. They should see their health care provider and be treated if they have problems, such as a mild rash or itching.  Practice safe sex by using condoms and only having one sex partner. SEEK MEDICAL CARE IF:   Your symptoms are not improving after 3 days of treatment.  You have increased discharge or pain.  You have a fever. MAKE SURE YOU:   Understand these instructions.  Will watch your condition.  Will get help right away if you are not doing well or get worse. FOR MORE INFORMATION  Centers for Disease Control and Prevention, Division of STD Prevention: SolutionApps.co.zawww.cdc.gov/std American Sexual Health Association (ASHA): www.ashastd.org  Document Released: 04/03/2005 Document Revised: 01/22/2013 Document Reviewed: 11/13/2012 [facility] Patient Information 2015 [place], [place]. This information is not intended to replace advice given to you by your health care provider. Make sure you discuss any questions you have with your health care provider. Pelvic Rest Pelvic rest is sometimes recommended for women when:   The placenta is partially or completely covering the opening of the cervix (placenta previa).  There is bleeding between the uterine wall and the amniotic sac in the first trimester (subchorionic hemorrhage).  The cervix begins to open without labor starting (incompetent cervix, cervical insufficiency).  The labor is  too early (preterm labor). HOME CARE INSTRUCTIONS  Do not have sexual intercourse, stimulation, or an orgasm.  Do not use tampons, douche, or put anything in the vagina.  Do not lift anything over 10 pounds (4.5 kg).  Avoid strenuous activity or straining your pelvic muscles. SEEK MEDICAL CARE IF:  You have any vaginal bleeding during pregnancy. Treat this as a potential emergency.  You have cramping pain felt low in the stomach (stronger than menstrual cramps).  You notice vaginal discharge (watery, mucus, or bloody).  You have a low, dull backache.  There are regular contractions or uterine tightening. SEEK IMMEDIATE MEDICAL CARE IF: You have vaginal bleeding and have placenta previa.  Document Released: 07/29/2010 Document Revised: 06/26/2011 Document Reviewed: 07/29/2010 [facility] Patient Information 2015 [place], [place]. This information is not intended to replace advice given to you by your health care provider. Make sure you discuss any questions you have with your health care provider.

## 2014-01-18 NOTE — MAU Note (Signed)
Pt presents to MAU with complaints of early pregnancy and was at work today and lifted a heavy box and she is still having lower abdominal pain

## 2014-01-18 NOTE — MAU Provider Note (Signed)
Attestation of Attending Supervision of Advanced Practitioner (CNM/NP): Evaluation and management procedures were performed by the Advanced Practitioner under my supervision and collaboration.  I have reviewed the Advanced Practitioner's note and chart, and I agree with the management and plan.  Ahriana Gunkel 01/18/2014 5:12 PM   

## 2014-01-19 LAB — GC/CHLAMYDIA PROBE AMP
CT Probe RNA: NEGATIVE
GC Probe RNA: NEGATIVE

## 2014-01-27 ENCOUNTER — Encounter (HOSPITAL_COMMUNITY): Payer: Self-pay

## 2014-01-27 ENCOUNTER — Inpatient Hospital Stay (HOSPITAL_COMMUNITY)
Admission: AD | Admit: 2014-01-27 | Discharge: 2014-01-27 | Disposition: A | Discharge status: Home / Self Care | Payer: 59 | Source: Ambulatory Visit | Attending: Obstetrics & Gynecology | Admitting: Obstetrics & Gynecology

## 2014-01-27 DIAGNOSIS — O210 Mild hyperemesis gravidarum: Secondary | ICD-10-CM | POA: Diagnosis not present

## 2014-01-27 DIAGNOSIS — Z3A01 Less than 8 weeks gestation of pregnancy: Secondary | ICD-10-CM | POA: Diagnosis not present

## 2014-01-27 DIAGNOSIS — O99331 Smoking (tobacco) complicating pregnancy, first trimester: Secondary | ICD-10-CM | POA: Diagnosis not present

## 2014-01-27 DIAGNOSIS — O219 Vomiting of pregnancy, unspecified: Secondary | ICD-10-CM

## 2014-01-27 LAB — URINALYSIS, ROUTINE W REFLEX MICROSCOPIC
Bilirubin Urine: NEGATIVE
Glucose, UA: NEGATIVE mg/dL
Hgb urine dipstick: NEGATIVE
Ketones, ur: NEGATIVE mg/dL
Nitrite: NEGATIVE
Protein, ur: NEGATIVE mg/dL
Specific Gravity, Urine: 1.015 (ref 1.005–1.030)
Urobilinogen, UA: 0.2 mg/dL (ref 0.0–1.0)
pH: 8.5 — ABNORMAL HIGH (ref 5.0–8.0)

## 2014-01-27 LAB — URINE MICROSCOPIC-ADD ON

## 2014-01-27 MED ORDER — PROMETHAZINE HCL 25 MG/ML IJ SOLN
25.0000 mg | Freq: Once | INTRAVENOUS | Status: AC
  Administered 2014-01-27: 25 mg via INTRAVENOUS
  Filled 2014-01-27: qty 1

## 2014-01-27 MED ORDER — PROMETHAZINE HCL 25 MG RE SUPP: 25.0000 mg | Freq: Four times a day (QID) | RECTAL | Status: DC | PRN

## 2014-01-27 MED ORDER — PROMETHAZINE HCL 12.5 MG PO TABS: 12.5000 mg | ORAL_TABLET | Freq: Four times a day (QID) | ORAL | Status: DC | PRN

## 2014-01-27 NOTE — Discharge Instructions (Signed)
Morning Sickness °Morning sickness is when you feel sick to your stomach (nauseous) during pregnancy. You may feel sick to your stomach and throw up (vomit). You may feel sick in the morning, but you can feel this way any time of day. Some women feel very sick to their stomach and cannot stop throwing up (hyperemesis gravidarum). °HOME CARE °· Only take medicines as told by your doctor. °· Take multivitamins as told by your doctor. Taking multivitamins before getting pregnant can stop or lessen the harshness of morning sickness. °· Eat dry toast or unsalted crackers before getting out of bed. °· Eat 5 to 6 small meals a day. °· Eat dry and bland foods like rice and baked potatoes. °· Do not drink liquids with meals. Drink between meals. °· Do not eat greasy, fatty, or spicy foods. °· Have someone cook for you if the smell of food causes you to feel sick or throw up. °· If you feel sick to your stomach after taking prenatal vitamins, take them at night or with a snack. °· Eat protein when you need a snack (nuts, yogurt, cheese). °· Eat unsweetened gelatins for dessert. °· Wear a bracelet used for sea sickness (acupressure wristband). °· Go to a doctor that puts thin needles into certain body points (acupuncture) to improve how you feel. °· Do not smoke. °· Use a humidifier to keep the air in your house free of odors. °· Get lots of fresh air. °GET HELP IF: °· You need medicine to feel better. °· You feel dizzy or lightheaded. °· You are losing weight. °GET HELP RIGHT AWAY IF:  °· You feel very sick to your stomach and cannot stop throwing up. °· You pass out (faint). °MAKE SURE YOU: °· Understand these instructions. °· Will watch your condition. °· Will get help right away if you are not doing well or get worse. °Document Released: 05/11/2004 Document Revised: 04/08/2013 Document Reviewed: 09/18/2012 °ExitCare® Patient Information ©2015 [employer], LLC. This information is not intended to replace advice given to you by  your health care provider. Make sure you discuss any questions you have with your health care provider. ° °Eating Plan for Hyperemesis Gravidarum °Severe cases of hyperemesis gravidarum can lead to dehydration and malnutrition. The hyperemesis eating plan is one way to lessen the symptoms of nausea and vomiting. It is often used with prescribed medicines to control your symptoms.  °WHAT CAN I DO TO RELIEVE MY SYMPTOMS? °Listen to your body. Everyone is different and has different preferences. Find what works best for you. Some of the following things may help: °· Eat and drink slowly. °· Eat 5-6 small meals daily instead of 3 large meals.   °· Eat crackers before you get out of bed in the morning.   °· Starchy foods are usually well tolerated (such as cereal, toast, bread, potatoes, pasta, rice, and pretzels).   °· Ginger may help with nausea. Add ¼ tsp ground ginger to hot tea or choose ginger tea.   °· Try drinking 100% fruit juice or an electrolyte drink. °· Continue to take your prenatal vitamins as directed by your health care provider. If you are having trouble taking your prenatal vitamins, talk with your health care provider about different options. °· Include at least 1 serving of protein with your meals and snacks (such as meats or poultry, beans, nuts, eggs, or yogurt). Try eating a protein-rich snack before bed (such as cheese and crackers or a half turkey or peanut butter sandwich). °WHAT THINGS SHOULD I   AVOID TO REDUCE MY SYMPTOMS? °The following things may help reduce your symptoms: °· Avoid foods with strong smells. Try eating meals in well-ventilated areas that are free of odors. °· Avoid drinking water or other beverages with meals. Try not to drink anything less than 30 minutes before and after meals. °· Avoid drinking more than 1 cup of fluid at a time. °· Avoid fried or high-fat foods, such as butter and cream sauces. °· Avoid spicy foods. °· Avoid skipping meals the best you can. Nausea can be  more intense on an empty stomach. If you cannot tolerate food at that time, do not force it. Try sucking on ice chips or other frozen items and make up the calories later. °· Avoid lying down within 2 hours after eating. °Document Released: 01/29/2007 Document Revised: 04/08/2013 Document Reviewed: 02/05/2013 °ExitCare® Patient Information ©2015 [employer], LLC. This information is not intended to replace advice given to you by your health care provider. Make sure you discuss any questions you have with your health care provider. ° °

## 2014-01-27 NOTE — MAU Provider Note (Signed)
History     CSN: [id number]  Arrival date and time: 01/27/14 1014   First Provider Initiated Contact with Patient 01/27/14 1053      Chief Complaint  Patient presents with  . Emesis During Pregnancy   HPI Ms. Farrin L Charyl DancerGant is a 23 y.o. G1P0 at 7368w4d who presents to MAU today with complaint of N/V x 2 days. She states N/V prior to that as well, but worse recently. She states that she is unable to keep anything down. She denies abdominal pain, vaginal bleeding, UTI symptoms, fever or diarrhea.   OB History   Grav Para Term Preterm Abortions TAB SAB Ect Mult Living   1               Past Medical History  Diagnosis Date  . HSV-1 infection     History reviewed. No pertinent past surgical history.  Family History  Problem Relation Age of Onset  . Hypertension Mother   . Stroke Mother   . Arthritis Father   . Hypertension Maternal Grandmother   . Diabetes Paternal Grandmother     History  Substance Use Topics  . Smoking status: Current Every Day Smoker  . Smokeless tobacco: Not on file  . Alcohol Use: No    Allergies: No Known Allergies  Prescriptions prior to admission  Medication Sig Dispense Refill  . metroNIDAZOLE (FLAGYL) 500 MG tablet Take 1 tablet (500 mg total) by mouth 2 (two) times daily.  14 tablet  0  . valACYclovir (VALTREX) 500 MG tablet Take 1 tablet (500 mg total) by mouth 2 (two) times daily. For " flare-ups"  10 tablet  0    Review of Systems  Constitutional: Negative for fever and malaise/fatigue.  Gastrointestinal: Positive for nausea and vomiting. Negative for abdominal pain, diarrhea and constipation.  Genitourinary: Negative for dysuria, urgency and frequency.       Neg - vaginal bleeding   Physical Exam   Blood pressure 114/59, pulse 69, temperature 98.2 F (36.8 C), temperature source Oral, resp. rate 16, height 5\' 9"  (1.753 m), weight 227 lb 6.4 oz (103.148 kg), last menstrual period 12/05/2013, SpO2 100.00%.  Physical Exam   Constitutional: She is oriented to person, place, and time. She appears well-developed and well-nourished. No distress.  HENT:  Head: Normocephalic.  Cardiovascular: Normal rate.   Respiratory: Effort normal.  GI: Soft. She exhibits no distension and no mass. There is no tenderness. There is no rebound and no guarding.  Neurological: She is alert and oriented to person, place, and time.  Skin: Skin is warm and dry. No erythema.  Psychiatric: She has a normal mood and affect.   Results for orders placed during the hospital encounter of 01/27/14 (from the past 24 hour(s))  URINALYSIS, ROUTINE W REFLEX MICROSCOPIC     Status: Abnormal   Collection Time    01/27/14 10:35 AM      Result Value Ref Range   Color, Urine YELLOW  YELLOW   APPearance CLEAR  CLEAR   Specific Gravity, Urine 1.015  1.005 - 1.030   pH 8.5 (*) 5.0 - 8.0   Glucose, UA NEGATIVE  NEGATIVE mg/dL   Hgb urine dipstick NEGATIVE  NEGATIVE   Bilirubin Urine NEGATIVE  NEGATIVE   Ketones, ur NEGATIVE  NEGATIVE mg/dL   Protein, ur NEGATIVE  NEGATIVE mg/dL   Urobilinogen, UA 0.2  0.0 - 1.0 mg/dL   Nitrite NEGATIVE  NEGATIVE   Leukocytes, UA SMALL (*) NEGATIVE  URINE MICROSCOPIC-ADD ON  Status: Abnormal   Collection Time    01/27/14 10:35 AM      Result Value Ref Range   Squamous Epithelial / LPF RARE  RARE   WBC, UA 0-2  <3 WBC/hpf   Bacteria, UA FEW (*) RARE    MAU Course  Procedures None  MDM UA today Patient unable to tolerate PO, 1 liter D5LR with 25 mg Phenergan infusion ordered Patient reports significant improvement in N/V. No episodes of emesis while in MAU.  Assessment and Plan  A: Nausea and vomiting in pregnancy prior to [redacted] weeks gestation  P: Discharge home Rx for Phenergan PO and suppositories sent to patient's pharmacy Discussed diet for N/V in pregnancy Patient encouraged to follow-up with [facility] as scheduled for routine prenatal care Patient may return to MAU as needed or if her condition  were to change or worsen   Marny LowensteinJulie N Shant Hence, PA-C  01/27/2014, 11:30 AM

## 2014-01-27 NOTE — MAU Note (Signed)
Patient states she has had nausea with some vomiting until the past 2 days has not been able to keep anything down. Abdominal pain with the vomiting. Denies bleeding or discharge.

## 2014-01-27 NOTE — Progress Notes (Signed)
Joanne NippleJulie Wenzel PA in to see pt. Pt stated she could get ride to pick her up before iv given. Now states unable to get anyone as they are at work. To wait in lobby until able to get ride

## 2014-01-27 NOTE — MAU Note (Signed)
Pt's mom and sister are coming to pick her up.

## 2014-02-03 ENCOUNTER — Encounter (HOSPITAL_COMMUNITY): Payer: Self-pay | Admitting: *Deleted

## 2014-02-03 ENCOUNTER — Inpatient Hospital Stay (HOSPITAL_COMMUNITY)
Admission: AD | Admit: 2014-02-03 | Discharge: 2014-02-03 | Disposition: A | Discharge status: Home / Self Care | Payer: 59 | Source: Ambulatory Visit | Attending: Obstetrics & Gynecology | Admitting: Obstetrics & Gynecology

## 2014-02-03 DIAGNOSIS — Z3A08 8 weeks gestation of pregnancy: Secondary | ICD-10-CM | POA: Diagnosis not present

## 2014-02-03 DIAGNOSIS — O219 Vomiting of pregnancy, unspecified: Secondary | ICD-10-CM

## 2014-02-03 DIAGNOSIS — O99331 Smoking (tobacco) complicating pregnancy, first trimester: Secondary | ICD-10-CM | POA: Diagnosis not present

## 2014-02-03 DIAGNOSIS — O210 Mild hyperemesis gravidarum: Secondary | ICD-10-CM | POA: Diagnosis present

## 2014-02-03 LAB — URINALYSIS, ROUTINE W REFLEX MICROSCOPIC
Bilirubin Urine: NEGATIVE
Glucose, UA: NEGATIVE mg/dL
Hgb urine dipstick: NEGATIVE
Ketones, ur: NEGATIVE mg/dL
Nitrite: NEGATIVE
Protein, ur: NEGATIVE mg/dL
Specific Gravity, Urine: 1.025 (ref 1.005–1.030)
Urobilinogen, UA: 0.2 mg/dL (ref 0.0–1.0)
pH: 6 (ref 5.0–8.0)

## 2014-02-03 LAB — URINE MICROSCOPIC-ADD ON

## 2014-02-03 MED ORDER — METOCLOPRAMIDE HCL 10 MG PO TABS: 10.0000 mg | ORAL_TABLET | Freq: Four times a day (QID) | ORAL | Status: DC

## 2014-02-03 MED ORDER — METOCLOPRAMIDE HCL 10 MG PO TABS
10.0000 mg | ORAL_TABLET | Freq: Once | ORAL | Status: AC
  Administered 2014-02-03: 10 mg via ORAL
  Filled 2014-02-03: qty 1

## 2014-02-03 NOTE — MAU Provider Note (Signed)
History     CSN: [id number]  Arrival date and time: 02/03/14 1335   First Provider Initiated Contact with Patient 02/03/14 1408      Chief Complaint  Patient presents with  . Emesis During Pregnancy  . Abdominal Pain   HPI Ms. Joanne Huff is a 23 y.o. G1P0 at 5551w4d who presents to MAU today with complaint of N/V. The patient was seen recently for the same issue which has been present throughout the pregnancy. She was given Phenergan Rx at last visit and states that this is working well, but makes her too drowsy to take before work. She states one episode of vomiting today causing moderate midline suprapubic pulling pain with vomiting. Mildly tender now. Patient has IUP identified on previous US and denies vaginal bleeding today. She also denies fever or diarrhea. She states occasional constipation with last BM 3 days ago. Patient states that she is able to tolerate some PO liquids and solids.   OB History   Grav Para Term Preterm Abortions TAB SAB Ect Mult Living   1               Past Medical History  Diagnosis Date  . HSV-1 infection     Past Surgical History  Procedure Laterality Date  . No past surgeries      Family History  Problem Relation Age of Onset  . Hypertension Mother   . Stroke Mother   . Arthritis Father   . Hypertension Maternal Grandmother   . Diabetes Paternal Grandmother     History  Substance Use Topics  . Smoking status: Current Every Day Smoker  . Smokeless tobacco: Never Used  . Alcohol Use: No    Allergies: No Known Allergies  Prescriptions prior to admission  Medication Sig Dispense Refill  . Prenatal Vit-Min-FA-Fish Oil ([facility] PRENATAL GUMMY PO) Take 2 tablets by mouth daily.      . promethazine (PHENERGAN) 12.5 MG tablet Take 1 tablet (12.5 mg total) by mouth every 6 (six) hours as needed for nausea or vomiting.  30 tablet  0  . valACYclovir (VALTREX) 500 MG tablet Take 500 mg by mouth 2 (two) times daily as needed (flare ups).       . metroNIDAZOLE (FLAGYL) 500 MG tablet Take 1 tablet (500 mg total) by mouth 2 (two) times daily.  14 tablet  0    Review of Systems  Constitutional: Negative for fever and malaise/fatigue.  Gastrointestinal: Positive for nausea, vomiting, abdominal pain and constipation. Negative for diarrhea.  Genitourinary: Negative for dysuria, urgency and frequency.       Neg - vaginal bleeding   Physical Exam   Blood pressure 126/80, pulse 74, temperature 99.4 F (37.4 C), temperature source Oral, resp. rate 16, height 5\' 9"  (1.753 m), weight 227 lb (102.967 kg), last menstrual period 12/05/2013, SpO2 100.00%.  Physical Exam  Constitutional: She is oriented to person, place, and time. She appears well-developed and well-nourished. No distress.  HENT:  Head: Normocephalic.  Cardiovascular: Normal rate.   Respiratory: Effort normal.  GI: Soft. She exhibits no distension and no mass. There is tenderness (mild tenderness to palpation of the suprapubic region). There is no rebound and no guarding.  Neurological: She is alert and oriented to person, place, and time.  Skin: Skin is warm and dry. No erythema.  Psychiatric: She has a normal mood and affect.    Results for orders placed during the hospital encounter of 02/03/14 (from the past 24 hour(s))  URINALYSIS, ROUTINE W REFLEX MICROSCOPIC     Status: Abnormal   Collection Time    02/03/14  1:56 PM      Result Value Ref Range   Color, Urine YELLOW  YELLOW   APPearance HAZY (*) CLEAR   Specific Gravity, Urine 1.025  1.005 - 1.030   pH 6.0  5.0 - 8.0   Glucose, UA NEGATIVE  NEGATIVE mg/dL   Hgb urine dipstick NEGATIVE  NEGATIVE   Bilirubin Urine NEGATIVE  NEGATIVE   Ketones, ur NEGATIVE  NEGATIVE mg/dL   Protein, ur NEGATIVE  NEGATIVE mg/dL   Urobilinogen, UA 0.2  0.0 - 1.0 mg/dL   Nitrite NEGATIVE  NEGATIVE   Leukocytes, UA MODERATE (*) NEGATIVE  URINE MICROSCOPIC-ADD ON     Status: Abnormal   Collection Time    02/03/14  1:56 PM       Result Value Ref Range   Squamous Epithelial / LPF MANY (*) RARE   WBC, UA 3-6  <3 WBC/hpf   Bacteria, UA FEW (*) RARE   Urine-Other MUCOUS PRESENT      MAU Course  Procedures None  MDM UA today shows no signs of dehydration Reglan 10 mg PO given in MAU.  Patient able to tolerate PO in MAU today. No episodes of N/V  Assessment and Plan  A: SIUP at 544w4d Nausea and vomiting in pregnancy prior to [redacted] weeks gestation  P: Discharge home Rx for Reglan given to patient Patient advised to continue Phenergan PO and suppositories PRN when needed Urine culture pending Patient advised to keep scheduled appointment with [facility] to start prenatal care as planned Patient may return to MAU as needed or if her condition were to change or worsen  Marny LowensteinJulie N Wenzel, PA-C  02/03/2014, 4:11 PM

## 2014-02-03 NOTE — Discharge Instructions (Signed)

## 2014-02-03 NOTE — MAU Provider Note (Signed)
Attestation of Attending Supervision of Advanced Practitioner (PA/CNM/NP): Evaluation and management procedures were performed by the Advanced Practitioner under my supervision and collaboration.  I have reviewed the Advanced Practitioner's note and chart, and I agree with the management and plan.  Mattison Stuckey, MD, FACOG Attending Obstetrician & Gynecologist Faculty Practice, [facility] -    

## 2014-02-03 NOTE — MAU Note (Signed)
Patient states she has had nausea and vomiting today with pain in the right lower abdomen pain with the vomiting. Denies bleeding. States the medication we gave her makes her sleepy and cannot take during the day because she works.

## 2014-02-04 LAB — CULTURE, OB URINE: Colony Count: >=100000

## 2014-02-14 ENCOUNTER — Inpatient Hospital Stay (HOSPITAL_COMMUNITY)
Admission: AD | Admit: 2014-02-14 | Discharge: 2014-02-14 | Disposition: A | Discharge status: Home / Self Care | Payer: 59 | Source: Ambulatory Visit | Attending: Emergency Medicine | Admitting: Emergency Medicine

## 2014-02-14 ENCOUNTER — Encounter (HOSPITAL_COMMUNITY): Payer: Self-pay | Admitting: *Deleted

## 2014-02-14 DIAGNOSIS — Z3A10 10 weeks gestation of pregnancy: Secondary | ICD-10-CM | POA: Diagnosis not present

## 2014-02-14 DIAGNOSIS — Z792 Long term (current) use of antibiotics: Secondary | ICD-10-CM | POA: Diagnosis not present

## 2014-02-14 DIAGNOSIS — Z79899 Other long term (current) drug therapy: Secondary | ICD-10-CM | POA: Insufficient documentation

## 2014-02-14 DIAGNOSIS — O99331 Smoking (tobacco) complicating pregnancy, first trimester: Secondary | ICD-10-CM | POA: Insufficient documentation

## 2014-02-14 DIAGNOSIS — S0990XA Unspecified injury of head, initial encounter: Secondary | ICD-10-CM

## 2014-02-14 DIAGNOSIS — T148 Other injury of unspecified body region: Secondary | ICD-10-CM | POA: Insufficient documentation

## 2014-02-14 DIAGNOSIS — O9A211 Injury, poisoning and certain other consequences of external causes complicating pregnancy, first trimester: Secondary | ICD-10-CM | POA: Diagnosis not present

## 2014-02-14 DIAGNOSIS — Z8619 Personal history of other infectious and parasitic diseases: Secondary | ICD-10-CM | POA: Insufficient documentation

## 2014-02-14 DIAGNOSIS — S3991XA Unspecified injury of abdomen, initial encounter: Secondary | ICD-10-CM | POA: Insufficient documentation

## 2014-02-14 DIAGNOSIS — O210 Mild hyperemesis gravidarum: Secondary | ICD-10-CM | POA: Insufficient documentation

## 2014-02-14 DIAGNOSIS — T07XXXA Unspecified multiple injuries, initial encounter: Secondary | ICD-10-CM

## 2014-02-14 DIAGNOSIS — S29002A Unspecified injury of muscle and tendon of back wall of thorax, initial encounter: Secondary | ICD-10-CM | POA: Insufficient documentation

## 2014-02-14 DIAGNOSIS — S0993XA Unspecified injury of face, initial encounter: Secondary | ICD-10-CM | POA: Diagnosis not present

## 2014-02-14 DIAGNOSIS — F17200 Nicotine dependence, unspecified, uncomplicated: Secondary | ICD-10-CM | POA: Diagnosis not present

## 2014-02-14 LAB — URINALYSIS, ROUTINE W REFLEX MICROSCOPIC
Glucose, UA: NEGATIVE mg/dL
Hgb urine dipstick: NEGATIVE
Ketones, ur: NEGATIVE mg/dL
Nitrite: NEGATIVE
Protein, ur: NEGATIVE mg/dL
Specific Gravity, Urine: 1.02 (ref 1.005–1.030)
Urobilinogen, UA: 1 mg/dL (ref 0.0–1.0)
pH: 6 (ref 5.0–8.0)

## 2014-02-14 LAB — URINE MICROSCOPIC-ADD ON

## 2014-02-14 MED ORDER — ONDANSETRON 4 MG PO TBDP
4.0000 mg | ORAL_TABLET | Freq: Once | ORAL | Status: AC
  Administered 2014-02-14: 4 mg via ORAL
  Filled 2014-02-14: qty 1

## 2014-02-14 MED ORDER — ACETAMINOPHEN 500 MG PO TABS
1000.0000 mg | ORAL_TABLET | Freq: Once | ORAL | Status: AC
  Administered 2014-02-14: 1000 mg via ORAL
  Filled 2014-02-14: qty 2
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
## 2014-02-14 NOTE — MAU Note (Signed)
[facility]
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
## 2014-02-16 ENCOUNTER — Encounter (HOSPITAL_COMMUNITY): Payer: Self-pay | Admitting: *Deleted

## 2014-02-26 ENCOUNTER — Other Ambulatory Visit (HOSPITAL_COMMUNITY): Payer: Self-pay | Admitting: Physician Assistant

## 2014-02-26 DIAGNOSIS — Z369 Encounter for antenatal screening, unspecified: Secondary | ICD-10-CM

## 2014-02-26 LAB — OB RESULTS CONSOLE RPR: RPR: NONREACTIVE

## 2014-02-26 LAB — OB RESULTS CONSOLE RUBELLA ANTIBODY, IGM: Rubella: IMMUNE

## 2014-03-05 ENCOUNTER — Ambulatory Visit (HOSPITAL_COMMUNITY)
Admission: RE | Admit: 2014-03-05 | Discharge: 2014-03-05 | Disposition: A | Discharge status: Home / Self Care | Payer: 59 | Source: Ambulatory Visit | Attending: Physician Assistant | Admitting: Physician Assistant

## 2014-03-05 ENCOUNTER — Encounter (HOSPITAL_COMMUNITY): Payer: Self-pay

## 2014-03-05 DIAGNOSIS — Z3A12 12 weeks gestation of pregnancy: Secondary | ICD-10-CM | POA: Diagnosis not present

## 2014-03-05 DIAGNOSIS — O99210 Obesity complicating pregnancy, unspecified trimester: Secondary | ICD-10-CM | POA: Diagnosis not present

## 2014-03-05 DIAGNOSIS — Z36 Encounter for antenatal screening of mother: Secondary | ICD-10-CM | POA: Insufficient documentation

## 2014-03-05 DIAGNOSIS — B009 Herpesviral infection, unspecified: Secondary | ICD-10-CM | POA: Insufficient documentation

## 2014-03-05 DIAGNOSIS — O98519 Other viral diseases complicating pregnancy, unspecified trimester: Secondary | ICD-10-CM | POA: Insufficient documentation

## 2014-03-05 DIAGNOSIS — Z369 Encounter for antenatal screening, unspecified: Secondary | ICD-10-CM

## 2014-03-17 ENCOUNTER — Other Ambulatory Visit (HOSPITAL_COMMUNITY): Payer: Self-pay | Admitting: Nurse Practitioner

## 2014-03-17 DIAGNOSIS — Z3689 Encounter for other specified antenatal screening: Secondary | ICD-10-CM

## 2014-03-18 ENCOUNTER — Other Ambulatory Visit (HOSPITAL_COMMUNITY): Payer: Self-pay

## 2014-03-30 ENCOUNTER — Encounter (HOSPITAL_COMMUNITY): Payer: Self-pay | Admitting: *Deleted

## 2014-03-30 ENCOUNTER — Inpatient Hospital Stay (HOSPITAL_COMMUNITY)
Admission: AD | Admit: 2014-03-30 | Discharge: 2014-03-30 | Disposition: A | Discharge status: Home / Self Care | Payer: 59 | Source: Ambulatory Visit | Attending: Family Medicine | Admitting: Family Medicine

## 2014-03-30 DIAGNOSIS — Z3A16 16 weeks gestation of pregnancy: Secondary | ICD-10-CM | POA: Diagnosis not present

## 2014-03-30 DIAGNOSIS — R109 Unspecified abdominal pain: Secondary | ICD-10-CM

## 2014-03-30 DIAGNOSIS — O99332 Smoking (tobacco) complicating pregnancy, second trimester: Secondary | ICD-10-CM | POA: Diagnosis not present

## 2014-03-30 DIAGNOSIS — O99612 Diseases of the digestive system complicating pregnancy, second trimester: Secondary | ICD-10-CM

## 2014-03-30 DIAGNOSIS — K5900 Constipation, unspecified: Secondary | ICD-10-CM | POA: Insufficient documentation

## 2014-03-30 DIAGNOSIS — O26899 Other specified pregnancy related conditions, unspecified trimester: Secondary | ICD-10-CM

## 2014-03-30 DIAGNOSIS — O9989 Other specified diseases and conditions complicating pregnancy, childbirth and the puerperium: Secondary | ICD-10-CM | POA: Insufficient documentation

## 2014-03-30 LAB — URINALYSIS, ROUTINE W REFLEX MICROSCOPIC
Bilirubin Urine: NEGATIVE
Glucose, UA: NEGATIVE mg/dL
Hgb urine dipstick: NEGATIVE
Ketones, ur: NEGATIVE mg/dL
Leukocytes, UA: NEGATIVE
Nitrite: NEGATIVE
Protein, ur: NEGATIVE mg/dL
Specific Gravity, Urine: 1.015 (ref 1.005–1.030)
Urobilinogen, UA: 0.2 mg/dL (ref 0.0–1.0)
pH: 7.5 (ref 5.0–8.0)

## 2014-03-30 LAB — WET PREP, GENITAL
Clue Cells Wet Prep HPF POC: NONE SEEN
Trich, Wet Prep: NONE SEEN
Yeast Wet Prep HPF POC: NONE SEEN

## 2014-03-30 LAB — OB RESULTS CONSOLE GC/CHLAMYDIA
Chlamydia: NEGATIVE
Gonorrhea: NEGATIVE

## 2014-03-30 MED ORDER — ACETAMINOPHEN 500 MG PO TABS: 1000.0000 mg | ORAL_TABLET | Freq: Once | ORAL | Status: DC

## 2014-03-30 NOTE — MAU Provider Note (Signed)
History     CSN: [id number]  [id number] date and time: 03/30/14 0906   None     Chief Complaint  Patient presents with  . Abdominal Pain   HPI   Ms Joanne Huff is a 23 y.o. female G1P0 at 4759w3d who presents with bilateral lower abdominal pain that started last night. She is currently receving her care at the HD. She has not taken anything for the pain. The pain is described at cramping; and feels like she needs to have a BM.   OB History    Gravida Para Term Preterm AB TAB SAB Ectopic Multiple Living   1               Past Medical History  Diagnosis Date  . HSV-1 infection     Past Surgical History  Procedure Laterality Date  . No past surgeries      Family History  Problem Relation Age of Onset  . Hypertension Mother   . Stroke Mother   . Arthritis Father   . Hypertension Maternal Grandmother   . Diabetes Paternal Grandmother     History  Substance Use Topics  . Smoking status: Current Every Day Smoker  . Smokeless tobacco: Never Used  . Alcohol Use: No    Allergies: No Known Allergies  Prescriptions prior to admission  Medication Sig Dispense Refill Last Dose  . metoCLOPramide (REGLAN) 10 MG tablet Take 1 tablet (10 mg total) by mouth every 6 (six) hours. 30 tablet 1 new rx  . metroNIDAZOLE (FLAGYL) 500 MG tablet Take 1 tablet (500 mg total) by mouth 2 (two) times daily. 14 tablet 0 new rx  . Prenatal Vit-Min-FA-Fish Oil ([facility] PRENATAL GUMMY PO) Take 2 tablets by mouth daily.   02/13/2014 at Unknown time  . promethazine (PHENERGAN) 12.5 MG tablet Take 1 tablet (12.5 mg total) by mouth every 6 (six) hours as needed for nausea or vomiting. 30 tablet 0 02/12/2014 at Unknown time  . valACYclovir (VALTREX) 500 MG tablet Take 500 mg by mouth 2 (two) times daily as needed (flare ups).   prn flare   Results for orders placed or performed during the hospital encounter of 03/30/14 (from the past 48 hour(s))  Urinalysis, Routine w reflex microscopic     Status:  Abnormal   Collection Time: 03/30/14  9:42 AM  Result Value Ref Range   Color, Urine YELLOW YELLOW   APPearance HAZY (A) CLEAR   Specific Gravity, Urine 1.015 1.005 - 1.030   pH 7.5 5.0 - 8.0   Glucose, UA NEGATIVE NEGATIVE mg/dL   Hgb urine dipstick NEGATIVE NEGATIVE   Bilirubin Urine NEGATIVE NEGATIVE   Ketones, ur NEGATIVE NEGATIVE mg/dL   Protein, ur NEGATIVE NEGATIVE mg/dL   Urobilinogen, UA 0.2 0.0 - 1.0 mg/dL   Nitrite NEGATIVE NEGATIVE   Leukocytes, UA NEGATIVE NEGATIVE    Comment: MICROSCOPIC NOT DONE ON URINES WITH NEGATIVE PROTEIN, BLOOD, LEUKOCYTES, NITRITE, OR GLUCOSE <1000 mg/dL.  Wet prep, genital     Status: Abnormal   Collection Time: 03/30/14 10:35 AM  Result Value Ref Range   Yeast Wet Prep HPF POC NONE SEEN NONE SEEN   Trich, Wet Prep NONE SEEN NONE SEEN   Clue Cells Wet Prep HPF POC NONE SEEN NONE SEEN   WBC, Wet Prep HPF POC FEW (A) NONE SEEN    Comment: MODERATE BACTERIA SEEN    Review of Systems  Constitutional: Negative for fever and chills.  Gastrointestinal: Positive for nausea, vomiting (1  time), abdominal pain and constipation.   Physical Exam   Blood pressure 115/68, pulse 62, temperature 98.3 F (36.8 C), temperature source Oral, resp. rate 18, height 5\' 9"  (1.753 m), weight 100.245 kg (221 lb), last menstrual period 12/05/2013.  Physical Exam  Constitutional: She is oriented to person, place, and time. She appears well-developed and well-nourished. No distress.  HENT:  Head: Normocephalic.  Eyes: Pupils are equal, round, and reactive to light.  Neck: Neck supple.  Respiratory: Effort normal.  GI: Soft. She exhibits no distension. There is no tenderness. There is no rebound and no guarding.  Genitourinary:  Speculum exam: Vagina - Small amount of creamy discharge, no odor Cervix - No contact bleeding Bimanual exam: Cervix closed, no CMT  Uterus non tender, enlarged  Mild tenderness in suprapubic area GC/Chlam, wet prep done Chaperone  present for exam.   Musculoskeletal: Normal range of motion.  Neurological: She is alert and oriented to person, place, and time.  Skin: Skin is warm. She is not diaphoretic.  Psychiatric: Her behavior is normal.    MAU Course  Procedures  None  MDM +fht  UA Wet prep GC- pending   Assessment and Plan   A: Abdominal pain in pregnancy Constipation.   P: Discharge home in stable condition Ok to use miralax/ metamucil for constipation as directed on the bottle Return to MAU as needed, for emergencies Follow up with HD as scheduled Pregnancy support belt recommended.   Iona HansenJennifer Irene Lasandra Batley, NP 03/30/2014 11:32 AM

## 2014-03-30 NOTE — MAU Note (Signed)
Pt presents to MAU with complaints of pain in her lower abdomen since last night. Denies any vaginal bleeding or LOF

## 2014-03-30 NOTE — Discharge Instructions (Signed)

## 2014-03-31 LAB — GC/CHLAMYDIA PROBE AMP
CT Probe RNA: NEGATIVE
GC Probe RNA: NEGATIVE

## 2014-04-17 NOTE — L&D Delivery Note (Cosign Needed)
Patient is 24 y.o. G1P0 5470w5d admitted for IOL 2/2 preeclampsia with mild features (scotoma, elevated BP), hx of uncomplicated pregnancy  Delivery Note At 12:38 PM a viable female was delivered via Vaginal, Spontaneous Delivery (Presentation: ;  ).  APGAR: 8, 9; weight  pending Placenta status: Intact, Spontaneous.  Cord: 3 vessels with the following complications: None.  Anesthesia: Epidural  Episiotomy: None Lacerations: 2nd degree;Perineal;Sulcus => deep sulcal with extension to 2nd degree and left labia difficult to suture 2nd hair and weak subcutaneous tissue Suture Repair: 3.0 vicryl rapide Est. Blood Loss (mL):  500mL, 1000mcg given cytotec PR  Mom to postpartum.  Baby to Couplet care / Skin to Skin.  Caryl AdaJazma Alize Acy, DO 09/02/2014, 2:10 PM PGY-1, [facility]

## 2014-04-21 ENCOUNTER — Other Ambulatory Visit (HOSPITAL_COMMUNITY): Payer: 59 | Admitting: Nurse Practitioner

## 2014-04-21 ENCOUNTER — Ambulatory Visit (HOSPITAL_COMMUNITY)
Admission: RE | Admit: 2014-04-21 | Discharge: 2014-04-21 | Disposition: A | Discharge status: Home / Self Care | Payer: 59 | Source: Ambulatory Visit | Attending: Nurse Practitioner | Admitting: Nurse Practitioner

## 2014-04-21 DIAGNOSIS — Z3689 Encounter for other specified antenatal screening: Secondary | ICD-10-CM

## 2014-04-21 DIAGNOSIS — O99212 Obesity complicating pregnancy, second trimester: Secondary | ICD-10-CM | POA: Diagnosis not present

## 2014-04-21 DIAGNOSIS — Z3A19 19 weeks gestation of pregnancy: Secondary | ICD-10-CM | POA: Insufficient documentation

## 2014-04-21 DIAGNOSIS — A6000 Herpesviral infection of urogenital system, unspecified: Secondary | ICD-10-CM | POA: Diagnosis not present

## 2014-04-21 DIAGNOSIS — O98519 Other viral diseases complicating pregnancy, unspecified trimester: Secondary | ICD-10-CM | POA: Insufficient documentation

## 2014-04-21 DIAGNOSIS — B009 Herpesviral infection, unspecified: Secondary | ICD-10-CM | POA: Insufficient documentation

## 2014-04-21 DIAGNOSIS — O98312 Other infections with a predominantly sexual mode of transmission complicating pregnancy, second trimester: Secondary | ICD-10-CM | POA: Diagnosis not present

## 2014-04-21 DIAGNOSIS — Z36 Encounter for antenatal screening of mother: Secondary | ICD-10-CM | POA: Insufficient documentation

## 2014-05-12 ENCOUNTER — Other Ambulatory Visit (HOSPITAL_COMMUNITY): Payer: Self-pay | Admitting: Nurse Practitioner

## 2014-05-12 DIAGNOSIS — Z0489 Encounter for examination and observation for other specified reasons: Secondary | ICD-10-CM

## 2014-05-12 DIAGNOSIS — IMO0002 Reserved for concepts with insufficient information to code with codable children: Secondary | ICD-10-CM

## 2014-05-19 ENCOUNTER — Ambulatory Visit (HOSPITAL_COMMUNITY)
Admission: RE | Admit: 2014-05-19 | Discharge: 2014-05-19 | Disposition: A | Discharge status: Home / Self Care | Payer: 59 | Source: Ambulatory Visit | Attending: Nurse Practitioner | Admitting: Nurse Practitioner

## 2014-05-19 DIAGNOSIS — IMO0002 Reserved for concepts with insufficient information to code with codable children: Secondary | ICD-10-CM

## 2014-05-19 DIAGNOSIS — Z3A23 23 weeks gestation of pregnancy: Secondary | ICD-10-CM | POA: Diagnosis not present

## 2014-05-19 DIAGNOSIS — O99212 Obesity complicating pregnancy, second trimester: Secondary | ICD-10-CM | POA: Insufficient documentation

## 2014-05-19 DIAGNOSIS — Z36 Encounter for antenatal screening of mother: Secondary | ICD-10-CM | POA: Diagnosis not present

## 2014-05-19 DIAGNOSIS — Z0489 Encounter for examination and observation for other specified reasons: Secondary | ICD-10-CM | POA: Insufficient documentation

## 2014-05-19 DIAGNOSIS — O99210 Obesity complicating pregnancy, unspecified trimester: Secondary | ICD-10-CM | POA: Insufficient documentation

## 2014-06-02 ENCOUNTER — Inpatient Hospital Stay (HOSPITAL_COMMUNITY)
Admission: AD | Admit: 2014-06-02 | Discharge: 2014-06-02 | Disposition: A | Discharge status: Home / Self Care | Payer: 59 | Source: Ambulatory Visit | Attending: Family Medicine | Admitting: Family Medicine

## 2014-06-02 ENCOUNTER — Encounter (HOSPITAL_COMMUNITY): Payer: Self-pay | Admitting: *Deleted

## 2014-06-02 DIAGNOSIS — O210 Mild hyperemesis gravidarum: Secondary | ICD-10-CM | POA: Diagnosis present

## 2014-06-02 DIAGNOSIS — K529 Noninfective gastroenteritis and colitis, unspecified: Secondary | ICD-10-CM | POA: Insufficient documentation

## 2014-06-02 DIAGNOSIS — Z87891 Personal history of nicotine dependence: Secondary | ICD-10-CM | POA: Insufficient documentation

## 2014-06-02 DIAGNOSIS — O26892 Other specified pregnancy related conditions, second trimester: Secondary | ICD-10-CM | POA: Insufficient documentation

## 2014-06-02 DIAGNOSIS — O9989 Other specified diseases and conditions complicating pregnancy, childbirth and the puerperium: Secondary | ICD-10-CM

## 2014-06-02 DIAGNOSIS — Z3A25 25 weeks gestation of pregnancy: Secondary | ICD-10-CM

## 2014-06-02 LAB — URINALYSIS, ROUTINE W REFLEX MICROSCOPIC
Bilirubin Urine: NEGATIVE
Glucose, UA: NEGATIVE mg/dL
Hgb urine dipstick: NEGATIVE
Ketones, ur: NEGATIVE mg/dL
Leukocytes, UA: NEGATIVE
Nitrite: NEGATIVE
Protein, ur: NEGATIVE mg/dL
Specific Gravity, Urine: 1.02 (ref 1.005–1.030)
Urobilinogen, UA: 0.2 mg/dL (ref 0.0–1.0)
pH: 7 (ref 5.0–8.0)

## 2014-06-02 MED ORDER — ONDANSETRON 8 MG PO TBDP: 8.0000 mg | ORAL_TABLET | Freq: Three times a day (TID) | ORAL | Status: DC | PRN

## 2014-06-02 MED ORDER — PROMETHAZINE HCL 25 MG/ML IJ SOLN
25.0000 mg | Freq: Once | INTRAMUSCULAR | Status: AC
  Administered 2014-06-02: 25 mg via INTRAVENOUS
  Filled 2014-06-02: qty 1

## 2014-06-02 NOTE — MAU Note (Signed)
Pt. States she normally has nausea and vomiting every day but today she has thrown up 4 times since she woke up. Also, noted to have cramping after vomiiting. Did not have this prior to throwing up. Unsure if she is dehydrated now. Also, was crying after she threw up and wiped her nose and noticed a blood clot that was bright red in color that came from her nose. Denies any more bleeding after she noticed clot. Denies bleeding or abnormal discharge. Baby is moving well. Next OB appointment is February 24.

## 2014-06-02 NOTE — MAU Note (Signed)
Has been having cramping in lower abd.  Ongoing problem with morning sickness, seemed a little worse today.  Had a nose bleed this morning- a big clump of blood came out. Wanted to make sure the baby is ok

## 2014-06-02 NOTE — Discharge Instructions (Signed)

## 2014-06-02 NOTE — MAU Note (Signed)
Dr. Caroleen Hammanumley (resident) in department. Ok to take patient off monitor. To try giving po fluids and crackers at this time.

## 2014-06-02 NOTE — MAU Note (Signed)
Urine in lab 

## 2014-06-02 NOTE — MAU Provider Note (Signed)
History     CSN: [id number]  Arrival date and time: 06/02/14 1205   None     Chief Complaint  Patient presents with  . Abdominal Pain  . Epistaxis  . Emesis   HPI  OB History    Gravida Para Term Preterm AB TAB SAB Ectopic Multiple Living   1               Patient is 24 y.o. G1P0 255w4d here with complaints of vomiting x4 along with abdominal pain. Reports nausea and one episode of vomiting daily, however states her vomiting worsened today. Not on any medications for nausea or vomiting. Unsure if she is dehydrated. Unable to tolerate solids or liquids. States abdominal pain is located in lower quadrants and was associated with vomiting. Describes pain as "soreness" and believes she may have pulled a muscle while she was vomiting. Denies diarrhea. Denies fevers. Reports that she noted a blood clot on her tissue after blowing her nose, but denies any further bleeding.  [facility]    +FM, denies LOF, VB, contractions, vaginal discharge.  Past Medical History  Diagnosis Date  . HSV-1 infection     Past Surgical History  Procedure Laterality Date  . No past surgeries      Family History  Problem Relation Age of Onset  . Hypertension Mother   . Stroke Mother   . Arthritis Father   . Hypertension Maternal Grandmother   . Diabetes Paternal Grandmother     History  Substance Use Topics  . Smoking status: Former Smoker    Types: Cigarettes    Quit date: 03/02/2014  . Smokeless tobacco: Never Used  . Alcohol Use: No    Allergies: No Known Allergies  Prescriptions prior to admission  Medication Sig Dispense Refill Last Dose  . metoCLOPramide (REGLAN) 10 MG tablet Take 1 tablet (10 mg total) by mouth every 6 (six) hours. 30 tablet 1 new rx  . Prenatal Vit-Min-FA-Fish Oil ([facility] PRENATAL GUMMY PO) Take 2 tablets by mouth daily.   Past Week at Unknown time  . promethazine (PHENERGAN) 12.5 MG tablet Take 1 tablet (12.5 mg total) by mouth every  6 (six) hours as needed for nausea or vomiting. 30 tablet 0 02/12/2014 at Unknown time  . valACYclovir (VALTREX) 500 MG tablet Take 500 mg by mouth 2 (two) times daily as needed (flare ups).   prn flare    Review of Systems  Constitutional: Positive for malaise/fatigue. Negative for fever.  Respiratory: Negative for cough.   Gastrointestinal: Positive for nausea, vomiting and abdominal pain. Negative for diarrhea.  Genitourinary: Negative for dysuria and hematuria.   Physical Exam   Blood pressure 108/67, pulse 85, temperature 98.2 F (36.8 C), temperature source Oral, resp. rate 18, last menstrual period 12/05/2013.  Physical Exam  Constitutional: She appears well-developed and well-nourished. No distress.  HENT:  Moist mucous membranes  Respiratory: Effort normal.  GI: Soft. There is no tenderness.  Musculoskeletal: She exhibits no edema.  Skin: Skin is warm. No rash noted.  Psychiatric: She has a normal mood and affect. Her behavior is normal.    MAU Course  Procedures Urinalysis: normal Phenergan  Assessment and Plan  # Gastroenteritis: - Phenergan 25mg  IV x1. Discussed adverse effects. - Stable for discharge home - Discharge with improvement of nausea and tolerance of liquids and crackers - Discharge with prescription for Zofran.  - [facility] with [facility]. Return if emergency develops.  [place], [place]  N 06/02/2014, 12:56 PM   OB fellow attestation:  I have seen and examined this patient; I agree with above documentation in the resident's note.   Latish L Ogle is a 24 y.o. G1P0 reporting abdominal pain, nausea/vomiting.  Denies diarrhea +FM, denies LOF, VB, contractions, vaginal discharge.  PE: BP 108/50 mmHg  Pulse 73  Temp(Src) 98.2 F (36.8 C) (Oral)  Resp 16  LMP 12/05/2013 Gen: calm comfortable, NAD Resp: normal effort, no distress Abd: gravid  ROS, labs, PMH reviewed NST reactive  Plan: - fetal kick counts reinforced, preterm labor  precautions - continue routine follow up in OB clinic - given zofran ODT after declined phenergan IM, tolerating PO in MAU water/crackers  Perry Mount, MD 3:26 AM

## 2014-08-20 ENCOUNTER — Inpatient Hospital Stay (HOSPITAL_COMMUNITY)
Admission: AD | Admit: 2014-08-20 | Discharge: 2014-08-20 | Disposition: A | Discharge status: Home / Self Care | Payer: Medicaid Other | Source: Ambulatory Visit | Attending: Obstetrics & Gynecology | Admitting: Obstetrics & Gynecology

## 2014-08-20 ENCOUNTER — Inpatient Hospital Stay (HOSPITAL_COMMUNITY): Payer: Medicaid Other

## 2014-08-20 ENCOUNTER — Encounter (HOSPITAL_COMMUNITY): Payer: Self-pay

## 2014-08-20 DIAGNOSIS — O368190 Decreased fetal movements, unspecified trimester, not applicable or unspecified: Secondary | ICD-10-CM

## 2014-08-20 DIAGNOSIS — Z3A36 36 weeks gestation of pregnancy: Secondary | ICD-10-CM | POA: Insufficient documentation

## 2014-08-20 DIAGNOSIS — O368130 Decreased fetal movements, third trimester, not applicable or unspecified: Secondary | ICD-10-CM | POA: Insufficient documentation

## 2014-08-20 DIAGNOSIS — Z87891 Personal history of nicotine dependence: Secondary | ICD-10-CM | POA: Insufficient documentation

## 2014-08-20 DIAGNOSIS — O368131 Decreased fetal movements, third trimester, fetus 1: Secondary | ICD-10-CM

## 2014-08-20 LAB — URINALYSIS, ROUTINE W REFLEX MICROSCOPIC
Bilirubin Urine: NEGATIVE
Glucose, UA: NEGATIVE mg/dL
Hgb urine dipstick: NEGATIVE
Ketones, ur: 15 mg/dL — AB
Nitrite: NEGATIVE
Protein, ur: NEGATIVE mg/dL
Specific Gravity, Urine: >1.03 — ABNORMAL HIGH (ref 1.005–1.030)
Urobilinogen, UA: 0.2 mg/dL (ref 0.0–1.0)
pH: 6.5 (ref 5.0–8.0)

## 2014-08-20 LAB — URINE MICROSCOPIC-ADD ON

## 2014-08-20 MED ORDER — CALCIUM CARBONATE ANTACID 500 MG PO CHEW
1.0000 | CHEWABLE_TABLET | Freq: Once | ORAL | Status: AC
  Administered 2014-08-20: 200 mg via ORAL
  Filled 2014-08-20: qty 1

## 2014-08-20 MED ORDER — RANITIDINE HCL 150 MG PO TABS: 150.0000 mg | ORAL_TABLET | Freq: Two times a day (BID) | ORAL | Status: DC

## 2014-08-20 NOTE — MAU Note (Signed)
Pt stated she had not felt baby move all day. Has not had much of and appitite tody and had not eatedn anything all day had a little to drink.

## 2014-08-20 NOTE — Discharge Instructions (Signed)
Fetal Movement Counts °Patient Name: __________________________________________________ Patient Due Date: ____________________ °Performing a fetal movement count is highly recommended in high-risk pregnancies, but it is good for every pregnant woman to do. Your health care provider may ask you to start counting fetal movements at 28 weeks of the pregnancy. Fetal movements often increase: °· After eating a full meal. °· After physical activity. °· After eating or drinking something sweet or cold. °· At rest. °Pay attention to when you feel the baby is most active. This will help you notice a pattern of your baby's sleep and wake cycles and what factors contribute to an increase in fetal movement. It is important to perform a fetal movement count at the same time each day when your baby is normally most active.  °HOW TO COUNT FETAL MOVEMENTS °· Find a quiet and comfortable area to sit or lie down on your left side. Lying on your left side provides the best blood and oxygen circulation to your baby. °· Write down the day and time on a sheet of paper or in a journal. °· Start counting kicks, flutters, swishes, rolls, or jabs in a 2-hour period. You should feel at least 10 movements within 2 hours. °· If you do not feel 10 movements in 2 hours, wait 2-3 hours and count again. Look for a change in the pattern or not enough counts in 2 hours. °SEEK MEDICAL CARE IF: °· You feel less than 10 counts in 2 hours, tried twice. °· There is no movement in over an hour. °· The pattern is changing or taking longer each day to reach 10 counts in 2 hours. °· You feel the baby is not moving as he or she usually does. °Date: ____________ Movements: ____________ Start time: ____________ Finish time: ____________  °Date: ____________ Movements: ____________ Start time: ____________ Finish time: ____________ °Date: ____________ Movements: ____________ Start time: ____________ Finish time: ____________ °Date: ____________ Movements:  ____________ Start time: ____________ Finish time: ____________ °Date: ____________ Movements: ____________ Start time: ____________ Finish time: ____________ °Date: ____________ Movements: ____________ Start time: ____________ Finish time: ____________ °Date: ____________ Movements: ____________ Start time: ____________ Finish time: ____________ °Date: ____________ Movements: ____________ Start time: ____________ Finish time: ____________  °Date: ____________ Movements: ____________ Start time: ____________ Finish time: ____________ °Date: ____________ Movements: ____________ Start time: ____________ Finish time: ____________ °Date: ____________ Movements: ____________ Start time: ____________ Finish time: ____________ °Date: ____________ Movements: ____________ Start time: ____________ Finish time: ____________ °Date: ____________ Movements: ____________ Start time: ____________ Finish time: ____________ °Date: ____________ Movements: ____________ Start time: ____________ Finish time: ____________ °Date: ____________ Movements: ____________ Start time: ____________ Finish time: ____________  °Date: ____________ Movements: ____________ Start time: ____________ Finish time: ____________ °Date: ____________ Movements: ____________ Start time: ____________ Finish time: ____________ °Date: ____________ Movements: ____________ Start time: ____________ Finish time: ____________ °Date: ____________ Movements: ____________ Start time: ____________ Finish time: ____________ °Date: ____________ Movements: ____________ Start time: ____________ Finish time: ____________ °Date: ____________ Movements: ____________ Start time: ____________ Finish time: ____________ °Date: ____________ Movements: ____________ Start time: ____________ Finish time: ____________  °Date: ____________ Movements: ____________ Start time: ____________ Finish time: ____________ °Date: ____________ Movements: ____________ Start time: ____________ Finish  time: ____________ °Date: ____________ Movements: ____________ Start time: ____________ Finish time: ____________ °Date: ____________ Movements: ____________ Start time: ____________ Finish time: ____________ °Date: ____________ Movements: ____________ Start time: ____________ Finish time: ____________ °Date: ____________ Movements: ____________ Start time: ____________ Finish time: ____________ °Date: ____________ Movements: ____________ Start time: ____________ Finish time: ____________  °Date: ____________ Movements: ____________ Start time: ____________ Finish   time: ____________ °Date: ____________ Movements: ____________ Start time: ____________ Finish time: ____________ °Date: ____________ Movements: ____________ Start time: ____________ Finish time: ____________ °Date: ____________ Movements: ____________ Start time: ____________ Finish time: ____________ °Date: ____________ Movements: ____________ Start time: ____________ Finish time: ____________ °Date: ____________ Movements: ____________ Start time: ____________ Finish time: ____________ °Date: ____________ Movements: ____________ Start time: ____________ Finish time: ____________  °Date: ____________ Movements: ____________ Start time: ____________ Finish time: ____________ °Date: ____________ Movements: ____________ Start time: ____________ Finish time: ____________ °Date: ____________ Movements: ____________ Start time: ____________ Finish time: ____________ °Date: ____________ Movements: ____________ Start time: ____________ Finish time: ____________ °Date: ____________ Movements: ____________ Start time: ____________ Finish time: ____________ °Date: ____________ Movements: ____________ Start time: ____________ Finish time: ____________ °Date: ____________ Movements: ____________ Start time: ____________ Finish time: ____________  °Date: ____________ Movements: ____________ Start time: ____________ Finish time: ____________ °Date: ____________  Movements: ____________ Start time: ____________ Finish time: ____________ °Date: ____________ Movements: ____________ Start time: ____________ Finish time: ____________ °Date: ____________ Movements: ____________ Start time: ____________ Finish time: ____________ °Date: ____________ Movements: ____________ Start time: ____________ Finish time: ____________ °Date: ____________ Movements: ____________ Start time: ____________ Finish time: ____________ °Date: ____________ Movements: ____________ Start time: ____________ Finish time: ____________  °Date: ____________ Movements: ____________ Start time: ____________ Finish time: ____________ °Date: ____________ Movements: ____________ Start time: ____________ Finish time: ____________ °Date: ____________ Movements: ____________ Start time: ____________ Finish time: ____________ °Date: ____________ Movements: ____________ Start time: ____________ Finish time: ____________ °Date: ____________ Movements: ____________ Start time: ____________ Finish time: ____________ °Date: ____________ Movements: ____________ Start time: ____________ Finish time: ____________ °Document Released: 05/03/2006 Document Revised: 08/18/2013 Document Reviewed: 01/29/2012 °Exit[employer]® Patient Information ©2015 [employer]. This information is not intended to replace advice given to you by your health care provider. Make sure you discuss any questions you have with your health care provider. ° ° °Preterm Labor Information °Preterm labor is when labor starts before you are [redacted] weeks pregnant. The normal length of pregnancy is 39 to 41 weeks.  °CAUSES  °The cause of preterm labor is not often known. The most common known cause is infection. °RISK FACTORS °· Having a history of preterm labor. °· Having your water break before it should. °· Having a placenta that covers the opening of the cervix. °· Having a placenta that breaks away from the uterus. °· Having a cervix that is too weak to hold the baby  in the uterus. °· Having too much fluid in the amniotic sac. °· Taking drugs or smoking while pregnant. °· Not gaining enough weight while pregnant. °· Being younger than 18 and older than 25 years old. °· Having a low income. °· Being African American. °SYMPTOMS °· Period-like cramps, belly (abdominal) pain, or back pain. °· Contractions that are regular, as often as six in an hour. They may be mild or painful. °· Contractions that start at the top of the belly. They then move to the lower belly and back. °· Lower belly pressure that seems to get stronger. °· Bleeding from the vagina. °· Fluid leaking from the vagina. °TREATMENT  °Treatment depends on: °· Your condition. °· The condition of your baby. °· How many weeks pregnant you are. °Your doctor may have you: °· Take medicine to stop contractions. °· Stay in bed except to use the restroom (bed rest). °· Stay in the hospital. °WHAT SHOULD YOU DO IF YOU THINK YOU ARE IN PRETERM LABOR? °Call your doctor right away. You need to go to the hospital right away.  °HOW CAN YOU PREVENT PRETERM   LABOR IN FUTURE PREGNANCIES? °· Stop smoking, if you smoke. °· Maintain healthy weight gain. °· Do not take drugs or be around chemicals that are not needed. °· Tell your doctor if you think you have an infection. °· Tell your doctor if you had a preterm labor before. °Document Released: 06/30/2008 Document Revised: 01/22/2013 Document Reviewed: 06/30/2008 °ExitCare® Patient Information ©2015 [employer]. This information is not intended to replace advice given to you by your health care provider. Make sure you discuss any questions you have with your health care provider. ° °

## 2014-08-20 NOTE — MAU Provider Note (Signed)
History     CSN: [id number]  Arrival date and time: 08/20/14 1725   None    Chief Complaint  Patient presents with  . Decreased Fetal Movement   HPI Patient is 24 y.o. G1P0 2683w6d here with complaints of decreased fetal movement and decreased PO intake since today.  Not drinking well and not eating at all today.  Endorses heartburn, nausea, braxton hicks.  Denies vomiting, fevers, chills, hematuria, dysuria.    Patient now reports that baby is moving since she started drinking fluids but is still not quite at baseline.  Now endorsing heartburn with fetal movement.  Denies LOF, VB, vaginal discharge.  OB History    Gravida Para Term Preterm AB TAB SAB Ectopic Multiple Living   1               Past Medical History  Diagnosis Date  . HSV-1 infection     Past Surgical History  Procedure Laterality Date  . No past surgeries      Family History  Problem Relation Age of Onset  . Hypertension Mother   . Stroke Mother   . Arthritis Father   . Hypertension Maternal Grandmother   . Diabetes Paternal Grandmother     History  Substance Use Topics  . Smoking status: Former Smoker    Types: Cigarettes    Quit date: 03/02/2014  . Smokeless tobacco: Never Used  . Alcohol Use: No    Allergies: No Known Allergies  Prescriptions prior to admission  Medication Sig Dispense Refill Last Dose  . ondansetron (ZOFRAN ODT) 8 MG disintegrating tablet Take 1 tablet (8 mg total) by mouth every 8 (eight) hours as needed for nausea or vomiting. 20 tablet 0   . Prenatal Vit-Min-FA-Fish Oil ([facility] PRENATAL GUMMY PO) Take 2 tablets by mouth daily.   Past Week at Unknown time    Review of Systems  Constitutional: Negative for fever and chills.  Eyes: Negative for blurred vision and double vision.  Respiratory: Negative for shortness of breath.   Cardiovascular: Positive for leg swelling. Negative for chest pain and palpitations.  Gastrointestinal: Positive for heartburn and nausea.  Negative for vomiting, abdominal pain and diarrhea.  Genitourinary: Negative for dysuria, urgency, frequency and hematuria.  Musculoskeletal: Negative for back pain and falls.  Skin: Negative for rash.  Neurological: Negative for headaches.   Physical Exam   Blood pressure 146/81, pulse 68, temperature 98.4 F (36.9 C), temperature source Oral, resp. rate 18, height 5\' 9"  (1.753 m), weight 254 lb 6.4 oz (115.395 kg), last menstrual period 12/05/2013.  Physical Exam  Constitutional: She is oriented to person, place, and time. She appears well-developed and well-nourished. No distress.  HENT:  Head: Normocephalic and atraumatic.  Eyes: EOM are normal. No scleral icterus.  Neck: Normal range of motion. Neck supple.  Cardiovascular: Normal rate, regular rhythm and intact distal pulses.   Respiratory: Effort normal. No respiratory distress.  GI: Soft. There is no tenderness. There is no rebound and no guarding.  gravid  Musculoskeletal: Normal range of motion. She exhibits edema (trace pedal edema b/l).  Neurological: She is alert and oriented to person, place, and time.  Skin: Skin is warm and dry.  Psychiatric: She has a normal mood and affect. Her behavior is normal. Thought content normal.   Fetal monitoringBaseline: 135 bpm, Variability: Good {> 6 bpm) and Accelerations: Reactive Uterine activity uterine irritation  MAU Course  Procedures  MDM  NST reactive BPP  Tums PO hydration  Assessment  and Plan  Decreased fetal movement -BPP 8/8 -NST reactive -PO hydration encouraged -Return precautions reviewed -Patient to follow up with HD as scheduled for routine PNC  Heartburn -Tums in MAU -Rx Zantac -Instructed to eat small meals and avoid heartburn provoking foods  Delynn FlavinGottschalk, Erlene Devita M, DO 08/20/2014, 6:04 PM

## 2014-08-20 NOTE — Progress Notes (Signed)
Patient now feeling fetal movement.

## 2014-08-20 NOTE — MAU Note (Signed)
Notified Doylene CanardAshley Gottschalk MD no fetal movement all day, fhr reassuring, MD to order BPP>

## 2014-08-25 LAB — OB RESULTS CONSOLE GBS
GBS: NEGATIVE
GBS: NEGATIVE
GBS: POSITIVE

## 2014-08-26 LAB — OB RESULTS CONSOLE GBS: GBS: POSITIVE

## 2014-09-01 ENCOUNTER — Inpatient Hospital Stay (HOSPITAL_COMMUNITY)
Admission: AD | Admit: 2014-09-01 | Discharge: 2014-09-04 | DRG: 774 | Disposition: A | Discharge status: Home / Self Care | Payer: Medicaid Other | Source: Ambulatory Visit | Attending: Family Medicine | Admitting: Family Medicine

## 2014-09-01 ENCOUNTER — Encounter (HOSPITAL_COMMUNITY): Payer: Self-pay | Admitting: *Deleted

## 2014-09-01 DIAGNOSIS — Z79899 Other long term (current) drug therapy: Secondary | ICD-10-CM

## 2014-09-01 DIAGNOSIS — O1493 Unspecified pre-eclampsia, third trimester: Secondary | ICD-10-CM | POA: Diagnosis present

## 2014-09-01 DIAGNOSIS — Z6837 Body mass index (BMI) 37.0-37.9, adult: Secondary | ICD-10-CM | POA: Diagnosis not present

## 2014-09-01 DIAGNOSIS — O1490 Unspecified pre-eclampsia, unspecified trimester: Secondary | ICD-10-CM | POA: Diagnosis present

## 2014-09-01 DIAGNOSIS — O1403 Mild to moderate pre-eclampsia, third trimester: Secondary | ICD-10-CM | POA: Diagnosis present

## 2014-09-01 DIAGNOSIS — Z3A38 38 weeks gestation of pregnancy: Secondary | ICD-10-CM | POA: Diagnosis present

## 2014-09-01 DIAGNOSIS — A6000 Herpesviral infection of urogenital system, unspecified: Secondary | ICD-10-CM | POA: Diagnosis present

## 2014-09-01 DIAGNOSIS — O99214 Obesity complicating childbirth: Secondary | ICD-10-CM | POA: Diagnosis present

## 2014-09-01 DIAGNOSIS — E669 Obesity, unspecified: Secondary | ICD-10-CM | POA: Diagnosis present

## 2014-09-01 DIAGNOSIS — O99824 Streptococcus B carrier state complicating childbirth: Secondary | ICD-10-CM | POA: Diagnosis present

## 2014-09-01 DIAGNOSIS — O98313 Other infections with a predominantly sexual mode of transmission complicating pregnancy, third trimester: Secondary | ICD-10-CM | POA: Diagnosis present

## 2014-09-01 DIAGNOSIS — O09299 Supervision of pregnancy with other poor reproductive or obstetric history, unspecified trimester: Secondary | ICD-10-CM | POA: Diagnosis present

## 2014-09-01 DIAGNOSIS — Z87891 Personal history of nicotine dependence: Secondary | ICD-10-CM

## 2014-09-01 HISTORY — DX: Trichomoniasis, unspecified: A59.9

## 2014-09-01 HISTORY — DX: Cannabis use, unspecified, uncomplicated: F12.90

## 2014-09-01 HISTORY — DX: Chlamydial infection, unspecified: A74.9

## 2014-09-01 HISTORY — DX: Papillomavirus as the cause of diseases classified elsewhere: B97.7

## 2014-09-01 HISTORY — DX: Nicotine dependence, unspecified, uncomplicated: F17.200

## 2014-09-01 HISTORY — DX: Unspecified ovarian cyst, unspecified side: N83.209

## 2014-09-01 LAB — COMPREHENSIVE METABOLIC PANEL
ALT: 15 U/L (ref 14–54)
AST: 19 U/L (ref 15–41)
Albumin: 3.1 g/dL — ABNORMAL LOW (ref 3.5–5.0)
Alkaline Phosphatase: 156 U/L — ABNORMAL HIGH (ref 38–126)
Anion gap: 9 (ref 5–15)
BUN: 9 mg/dL (ref 6–20)
CO2: 20 mmol/L — ABNORMAL LOW (ref 22–32)
Calcium: 9.1 mg/dL (ref 8.9–10.3)
Chloride: 105 mmol/L (ref 101–111)
Creatinine, Ser: 0.54 mg/dL (ref 0.44–1.00)
GFR calc Af Amer: >60 mL/min (ref >60)
GFR calc non Af Amer: >60 mL/min (ref >60)
Glucose, Bld: 71 mg/dL (ref 65–99)
Potassium: 3.8 mmol/L (ref 3.5–5.1)
Sodium: 134 mmol/L — ABNORMAL LOW (ref 135–145)
Total Bilirubin: 0.3 mg/dL (ref 0.3–1.2)
Total Protein: 6.6 g/dL (ref 6.5–8.1)

## 2014-09-01 LAB — CBC
HCT: 33.6 % — ABNORMAL LOW (ref 36.0–46.0)
HCT: 36 % (ref 36.0–46.0)
Hemoglobin: 11.5 g/dL — ABNORMAL LOW (ref 12.0–15.0)
Hemoglobin: 12.1 g/dL (ref 12.0–15.0)
MCH: 29.1 pg (ref 26.0–34.0)
MCH: 29.3 pg (ref 26.0–34.0)
MCHC: 33.6 g/dL (ref 30.0–36.0)
MCHC: 34.2 g/dL (ref 30.0–36.0)
MCV: 85.7 fL (ref 78.0–100.0)
MCV: 86.5 fL (ref 78.0–100.0)
Platelets: 273 10*3/uL (ref 150–400)
Platelets: 284 10*3/uL (ref 150–400)
RBC: 3.92 MIL/uL (ref 3.87–5.11)
RBC: 4.16 MIL/uL (ref 3.87–5.11)
RDW: 13.6 % (ref 11.5–15.5)
RDW: 13.6 % (ref 11.5–15.5)
WBC: 10 10*3/uL (ref 4.0–10.5)
WBC: 6.6 10*3/uL (ref 4.0–10.5)

## 2014-09-01 LAB — PROTEIN / CREATININE RATIO, URINE
Creatinine, Urine: 78 mg/dL
Protein Creatinine Ratio: 0.14 mg/mg{Cre} (ref 0.00–0.15)
Total Protein, Urine: 11 mg/dL

## 2014-09-01 LAB — TYPE AND SCREEN
ABO/RH(D): A POS
Antibody Screen: NEGATIVE

## 2014-09-01 MED ORDER — PENICILLIN G POTASSIUM 5000000 UNITS IJ SOLR
2.5000 10*6.[IU] | INTRAVENOUS | Status: DC
  Administered 2014-09-01 – 2014-09-02 (×4): 2.5 10*6.[IU] via INTRAVENOUS
  Filled 2014-09-01 (×11): qty 2.5

## 2014-09-01 MED ORDER — OXYTOCIN 40 UNITS IN LACTATED RINGERS INFUSION - SIMPLE MED: 62.5000 mL/h | INTRAVENOUS | Status: DC

## 2014-09-01 MED ORDER — FENTANYL CITRATE (PF) 100 MCG/2ML IJ SOLN
100.0000 ug | Freq: Once | INTRAMUSCULAR | Status: AC
  Administered 2014-09-01: 100 ug via INTRAVENOUS
  Filled 2014-09-01: qty 2

## 2014-09-01 MED ORDER — ACETAMINOPHEN 325 MG PO TABS: 650.0000 mg | ORAL_TABLET | ORAL | Status: DC | PRN

## 2014-09-01 MED ORDER — FENTANYL CITRATE (PF) 100 MCG/2ML IJ SOLN
100.0000 ug | INTRAMUSCULAR | Status: DC | PRN
  Administered 2014-09-01 (×3): 100 ug via INTRAVENOUS
  Filled 2014-09-01 (×3): qty 2

## 2014-09-01 MED ORDER — OXYTOCIN BOLUS FROM INFUSION
500.0000 mL | INTRAVENOUS | Status: DC
  Administered 2014-09-02: 500 mL via INTRAVENOUS

## 2014-09-01 MED ORDER — MISOPROSTOL 50MCG HALF TABLET
50.0000 ug | ORAL_TABLET | ORAL | Status: DC | PRN
  Administered 2014-09-01: 50 ug via ORAL
  Filled 2014-09-01 (×2): qty 1

## 2014-09-01 MED ORDER — OXYTOCIN 40 UNITS IN LACTATED RINGERS INFUSION - SIMPLE MED
1.0000 m[IU]/min | INTRAVENOUS | Status: DC
  Administered 2014-09-01: 2 m[IU]/min via INTRAVENOUS
  Filled 2014-09-01: qty 1000

## 2014-09-01 MED ORDER — LIDOCAINE HCL (PF) 1 % IJ SOLN
30.0000 mL | INTRAMUSCULAR | Status: DC | PRN
  Administered 2014-09-02: 30 mL via SUBCUTANEOUS
  Filled 2014-09-01: qty 30

## 2014-09-01 MED ORDER — TERBUTALINE SULFATE 1 MG/ML IJ SOLN: 0.2500 mg | Freq: Once | INTRAMUSCULAR | Status: AC | PRN

## 2014-09-01 MED ORDER — ONDANSETRON HCL 4 MG/2ML IJ SOLN
4.0000 mg | Freq: Four times a day (QID) | INTRAMUSCULAR | Status: DC | PRN
  Administered 2014-09-01: 4 mg via INTRAVENOUS
  Filled 2014-09-01: qty 2

## 2014-09-01 MED ORDER — OXYCODONE-ACETAMINOPHEN 5-325 MG PO TABS
1.0000 | ORAL_TABLET | ORAL | Status: DC | PRN
  Filled 2014-09-01: qty 1

## 2014-09-01 MED ORDER — OXYCODONE-ACETAMINOPHEN 5-325 MG PO TABS: 2.0000 | ORAL_TABLET | ORAL | Status: DC | PRN

## 2014-09-01 MED ORDER — LACTATED RINGERS IV SOLN: 500.0000 mL | INTRAVENOUS | Status: DC | PRN

## 2014-09-01 MED ORDER — PENICILLIN G POTASSIUM 5000000 UNITS IJ SOLR
5.0000 10*6.[IU] | Freq: Once | INTRAVENOUS | Status: AC
  Administered 2014-09-01: 5 10*6.[IU] via INTRAVENOUS
  Filled 2014-09-01: qty 5

## 2014-09-01 MED ORDER — LACTATED RINGERS IV SOLN
INTRAVENOUS | Status: DC
  Administered 2014-09-01 – 2014-09-02 (×3): via INTRAVENOUS

## 2014-09-01 MED ORDER — CITRIC ACID-SODIUM CITRATE 334-500 MG/5ML PO SOLN
30.0000 mL | ORAL | Status: DC | PRN
  Administered 2014-09-02: 30 mL via ORAL
  Filled 2014-09-01: qty 15

## 2014-09-01 NOTE — Progress Notes (Signed)
Joanne Huff is a 24 y.o. G1P0 at 5759w4d by ultrasound admitted for induction of labor due to Pre-eclamptic toxemia of pregnancy..  Subjective: Doing OK, Better with analgesia  Objective: BP 153/87 mmHg  Pulse 65  Temp(Src) 98.2 F (36.8 C) (Oral)  Resp 20  Ht 5\' 9"  (1.753 m)  Wt 255 lb (115.667 kg)  BMI 37.64 kg/m2  LMP 12/05/2013      FHT:  FHR: 135 bpm, variability: moderate,  accelerations:  Present,  decelerations:  Absent UC:   regular, every 3 minutes SVE:   Dilation: 6 Effacement (%): 80 Station: -1 Exam by:: LCarpenter,RN  Labs: Lab Results  Component Value Date   WBC 6.6 09/01/2014   HGB 12.1 09/01/2014   HCT 36.0 09/01/2014   MCV 86.5 09/01/2014   PLT 273 09/01/2014    Assessment / Plan: Induction of labor due to preeclampsia,  progressing well on pitocin  Labor: Progressing normally Preeclampsia:  labs stable Fetal Wellbeing:  Category I Pain Control:  Fentanyl I/D:  n/a Anticipated MOD:  NSVD  Joanne Huff 09/01/2014, 9:47 PM

## 2014-09-01 NOTE — H&P (Signed)
LABOR ADMISSION HISTORY AND PHYSICAL  Erline L Charyl DancerGant is a 24 y.o. female G1P0 with IUP at 1980w4d by LMP presenting for IOL 2/2 preEclampsia, referred from HD. Pt's history significant for trich treated 03/04/2014, THC, smoker, and hx of HSV.  She reports +FMs, No LOF, no VB, no blurry vision, headaches or peripheral edema, and RUQ pain.  She plans on bottle feeding. She request condoms vs nexplanon for birth control.  Dating: By LMP c/w  --->  Estimated Date of Delivery: 09/11/14   Prenatal History/Complications:  Past Medical History: Past Medical History  Diagnosis Date  . HSV-1 infection     Past Surgical History: Past Surgical History  Procedure Laterality Date  . No past surgeries      Obstetrical History: OB History    Gravida Para Term Preterm AB TAB SAB Ectopic Multiple Living   1               Social History: History   Social History  . Marital Status: Single    Spouse Name: N/A  . Number of Children: N/A  . Years of Education: N/A   Social History Main Topics  . Smoking status: Former Smoker -- 0.50 packs/day for 2 years    Types: Cigarettes    Quit date: 03/02/2014  . Smokeless tobacco: Never Used  . Alcohol Use: No  . Drug Use: Yes    Special: Marijuana  . Sexual Activity: Yes    Birth Control/ Protection: None   Other Topics Concern  . None   Social History Narrative    Family History: Family History  Problem Relation Age of Onset  . Hypertension Mother   . Stroke Mother   . Arthritis Father   . Hypertension Maternal Grandmother   . Diabetes Paternal Grandmother     Allergies: No Known Allergies  Prescriptions prior to admission  Medication Sig Dispense Refill Last Dose  . ondansetron (ZOFRAN ODT) 8 MG disintegrating tablet Take 1 tablet (8 mg total) by mouth every 8 (eight) hours as needed for nausea or vomiting. (Patient not taking: Reported on 08/20/2014) 20 tablet 0   . Prenatal Vit-Min-FA-Fish Oil ([facility] PRENATAL GUMMY PO) Take 2  tablets by mouth daily.   08/19/2014 at Unknown time  . promethazine (PHENERGAN) 25 MG tablet Take 25 mg by mouth every 6 (six) hours as needed for nausea or vomiting.   Past Month at Unknown time  . ranitidine (ZANTAC) 150 MG tablet Take 1 tablet (150 mg total) by mouth 2 (two) times daily. 60 tablet 1   . valACYclovir (VALTREX) 500 MG tablet Take 500 mg by mouth 2 (two) times daily.   08/19/2014 at Unknown time  . Witch Hazel (PREPARATION H EX) Apply 1 application topically daily.   08/19/2014 at Unknown time     Review of Systems   All systems reviewed and negative except as stated in HPI  Height 5\' 9"  (1.753 m), weight 255 lb (115.667 kg), last menstrual period 12/05/2013. General appearance: alert and cooperative Lungs: clear to auscultation bilaterally Heart: regular rate and rhythm Abdomen: soft, non-tender; bowel sounds normal Pelvic: adequate, no HSV lesions/ulcers/vesicles Extremities: Homans sign is negative, no sign of DVT DTR's 2+ Presentation: cephalic Fetal monitoringBaseline: 135 bpm, Variability: Good {> 6 bpm), Accelerations: Non-reactive but appropriate for gestational age and Decelerations: Absent Uterine activityNone     Prenatal labs: ABO, Rh: --/--/A POS (10/04 1355) Antibody:   Rubella:   RPR:    HBsAg:    HIV: NONREACTIVE (  10/04 1355)  GBS:    1 hr Glucola 104, 116 Genetic screening  Neg Quad Anatomy US normal  Prenatal Transfer Tool  Maternal Diabetes: No Genetic Screening: Normal Maternal Ultrasounds/Referrals: Normal Fetal Ultrasounds or other Referrals:  None Maternal Substance Abuse:  Yes:  Type: Marijuana per HD records, however no positive UDS Significant Maternal Medications:  None Significant Maternal Lab Results: Lab values include: Group B Strep positive  No results found for this or any previous visit (from the past 24 hour(s)).  Patient Active Problem List   Diagnosis Date Noted  . Preeclampsia 09/01/2014  . [redacted] weeks gestation of  pregnancy   . Evaluate anatomy not seen on prior sonogram   . Obesity affecting pregnancy in second trimester, antepartum   . Encounter for fetal anatomic survey   . Obesity affecting pregnancy in second trimester   . HSV-2 infection complicating pregnancy   . [redacted] weeks gestation of pregnancy   . First trimester screening   . [redacted] weeks gestation of pregnancy     Assessment: Ronny L Charyl DancerGant is a 24 y.o. G1P0 at 8240w4d here for IOL 2/2 preeclampsia  #Labor: FB with PO cytotec at 1200, no active lesions, no contraindication to vaginal delivery #Pain: Declines currently #FWB: Cat I #ID:  PCN #MOF: bottle #MOC:nexplanon vs condoms #Circ:  OP circ #preE: no severe range BP at this time, labs ordered #hx of THC, unclear if pt used THC prior to or during pregnancy, no positive UDS on records   Keyvon Herter ROCIO 09/01/2014, 11:10 AM

## 2014-09-01 NOTE — Progress Notes (Signed)
Labor Progress Note  Joanne Huff is a 24 y.o. G1P0 at 6390w4d  admitted for induction of labor due to preeclampsia.  S: Doing well. Starting to feel her contractions more but still declining epidural. IV pain medication is controlling discomfort. No PIH symptoms.  O:  BP 154/83 mmHg  Pulse 64  Temp(Src) 98.3 F (36.8 C) (Oral)  Resp 20  Ht 5\' 9"  (1.753 m)  Wt 255 lb (115.667 kg)  BMI 37.64 kg/m2  LMP 12/05/2013     FHT:  FHR: 130 bpm, variability: moderate,  accelerations:  Present,  decelerations:  Absent UC:   regular, every 2-4 minutes SVE:   Dilation: 6 Effacement (%): 80 Station: -1 Exam by:: LCarpenter,RN  Dilation: 6 Effacement (%): 80 Cervical Position: Posterior Station: -1 Presentation: Vertex Exam by:: LCarpenter,RN  Pitocin @ 14 mu/min  Labs: Lab Results  Component Value Date   WBC 6.6 09/01/2014   HGB 12.1 09/01/2014   HCT 36.0 09/01/2014   MCV 86.5 09/01/2014   PLT 273 09/01/2014    Assessment / Plan: 24 y.o. G1P0 4190w4d active labor. Doing well. Induction of labor due to preeclampsia,  progressing well on pitocin  Labor: Progressing on Pitocin, will continue to increase then AROM Fetal Wellbeing:  Category I Pain Control:  Percocet PO Anticipated MOD:  NSVD  Expectant management   Caryl AdaJazma Phelps, DO 09/01/2014, 8:03 PM PGY-1, [facility]

## 2014-09-02 ENCOUNTER — Encounter (HOSPITAL_COMMUNITY): Payer: Self-pay | Admitting: *Deleted

## 2014-09-02 ENCOUNTER — Inpatient Hospital Stay (HOSPITAL_COMMUNITY): Payer: Medicaid Other | Admitting: Anesthesiology

## 2014-09-02 DIAGNOSIS — O98313 Other infections with a predominantly sexual mode of transmission complicating pregnancy, third trimester: Secondary | ICD-10-CM

## 2014-09-02 DIAGNOSIS — Z3A38 38 weeks gestation of pregnancy: Secondary | ICD-10-CM

## 2014-09-02 DIAGNOSIS — O99824 Streptococcus B carrier state complicating childbirth: Secondary | ICD-10-CM

## 2014-09-02 DIAGNOSIS — O1403 Mild to moderate pre-eclampsia, third trimester: Secondary | ICD-10-CM

## 2014-09-02 LAB — CBC
HCT: 30 % — ABNORMAL LOW (ref 36.0–46.0)
Hemoglobin: 10.1 g/dL — ABNORMAL LOW (ref 12.0–15.0)
MCH: 28.9 pg (ref 26.0–34.0)
MCHC: 33.7 g/dL (ref 30.0–36.0)
MCV: 86 fL (ref 78.0–100.0)
Platelets: 236 10*3/uL (ref 150–400)
RBC: 3.49 MIL/uL — ABNORMAL LOW (ref 3.87–5.11)
RDW: 13.6 % (ref 11.5–15.5)
WBC: 11.2 10*3/uL — ABNORMAL HIGH (ref 4.0–10.5)

## 2014-09-02 LAB — RPR: RPR Ser Ql: NONREACTIVE

## 2014-09-02 LAB — HIV ANTIBODY (ROUTINE TESTING W REFLEX): HIV Screen 4th Generation wRfx: NONREACTIVE

## 2014-09-02 MED ORDER — DIPHENHYDRAMINE HCL 50 MG/ML IJ SOLN: 12.5000 mg | INTRAMUSCULAR | Status: DC | PRN

## 2014-09-02 MED ORDER — BENZOCAINE-MENTHOL 20-0.5 % EX AERO
1.0000 "application " | INHALATION_SPRAY | CUTANEOUS | Status: DC | PRN
  Administered 2014-09-02 – 2014-09-04 (×2): 1 via TOPICAL
  Filled 2014-09-02 (×2): qty 56

## 2014-09-02 MED ORDER — PHENYLEPHRINE 40 MCG/ML (10ML) SYRINGE FOR IV PUSH (FOR BLOOD PRESSURE SUPPORT)
80.0000 ug | PREFILLED_SYRINGE | INTRAVENOUS | Status: DC | PRN
  Filled 2014-09-02: qty 2
  Filled 2014-09-02: qty 20

## 2014-09-02 MED ORDER — LIDOCAINE-EPINEPHRINE (PF) 2 %-1:200000 IJ SOLN
INTRAMUSCULAR | Status: DC | PRN
  Administered 2014-09-02: 3 mL

## 2014-09-02 MED ORDER — LANOLIN HYDROUS EX OINT: TOPICAL_OINTMENT | CUTANEOUS | Status: DC | PRN

## 2014-09-02 MED ORDER — TETANUS-DIPHTH-ACELL PERTUSSIS 5-2.5-18.5 LF-MCG/0.5 IM SUSP: 0.5000 mL | Freq: Once | INTRAMUSCULAR | Status: DC

## 2014-09-02 MED ORDER — WITCH HAZEL-GLYCERIN EX PADS: 1.0000 "application " | MEDICATED_PAD | CUTANEOUS | Status: DC | PRN

## 2014-09-02 MED ORDER — LACTATED RINGERS IV SOLN
INTRAVENOUS | Status: DC
  Administered 2014-09-02: 100 mL/h via INTRAUTERINE

## 2014-09-02 MED ORDER — MISOPROSTOL 200 MCG PO TABS
ORAL_TABLET | ORAL | Status: AC
  Filled 2014-09-02: qty 4

## 2014-09-02 MED ORDER — ONDANSETRON HCL 4 MG PO TABS: 4.0000 mg | ORAL_TABLET | ORAL | Status: DC | PRN

## 2014-09-02 MED ORDER — OXYCODONE-ACETAMINOPHEN 5-325 MG PO TABS: 2.0000 | ORAL_TABLET | ORAL | Status: DC | PRN

## 2014-09-02 MED ORDER — FENTANYL 2.5 MCG/ML BUPIVACAINE 1/10 % EPIDURAL INFUSION (WH - ANES)
14.0000 mL/h | INTRAMUSCULAR | Status: DC | PRN
  Administered 2014-09-02: 12 mL/h via EPIDURAL
  Administered 2014-09-02 (×2): 14 mL/h via EPIDURAL
  Filled 2014-09-02 (×2): qty 125

## 2014-09-02 MED ORDER — OXYCODONE-ACETAMINOPHEN 5-325 MG PO TABS
1.0000 | ORAL_TABLET | ORAL | Status: DC | PRN
  Administered 2014-09-03 – 2014-09-04 (×3): 1 via ORAL
  Filled 2014-09-02 (×2): qty 1

## 2014-09-02 MED ORDER — PRENATAL MULTIVITAMIN CH
1.0000 | ORAL_TABLET | Freq: Every day | ORAL | Status: DC
  Administered 2014-09-03 – 2014-09-04 (×2): 1 via ORAL
  Filled 2014-09-02 (×2): qty 1

## 2014-09-02 MED ORDER — IBUPROFEN 600 MG PO TABS
600.0000 mg | ORAL_TABLET | Freq: Four times a day (QID) | ORAL | Status: DC
  Administered 2014-09-02 – 2014-09-04 (×7): 600 mg via ORAL
  Filled 2014-09-02 (×8): qty 1

## 2014-09-02 MED ORDER — EPHEDRINE 5 MG/ML INJ
10.0000 mg | INTRAVENOUS | Status: DC | PRN
  Filled 2014-09-02: qty 2

## 2014-09-02 MED ORDER — DIPHENHYDRAMINE HCL 25 MG PO CAPS: 25.0000 mg | ORAL_CAPSULE | Freq: Four times a day (QID) | ORAL | Status: DC | PRN

## 2014-09-02 MED ORDER — DIBUCAINE 1 % RE OINT: 1.0000 "application " | TOPICAL_OINTMENT | RECTAL | Status: DC | PRN

## 2014-09-02 MED ORDER — SIMETHICONE 80 MG PO CHEW: 80.0000 mg | CHEWABLE_TABLET | ORAL | Status: DC | PRN

## 2014-09-02 MED ORDER — TERBUTALINE SULFATE 1 MG/ML IJ SOLN
INTRAMUSCULAR | Status: AC
  Filled 2014-09-02: qty 1

## 2014-09-02 MED ORDER — BUPIVACAINE HCL (PF) 0.25 % IJ SOLN
INTRAMUSCULAR | Status: DC | PRN
  Administered 2014-09-02 (×2): 4 mL

## 2014-09-02 MED ORDER — ZOLPIDEM TARTRATE 5 MG PO TABS: 5.0000 mg | ORAL_TABLET | Freq: Every evening | ORAL | Status: DC | PRN

## 2014-09-02 MED ORDER — TERBUTALINE SULFATE 1 MG/ML IJ SOLN
0.2500 mg | Freq: Once | INTRAMUSCULAR | Status: AC
  Administered 2014-09-02: 0.25 mg via SUBCUTANEOUS

## 2014-09-02 MED ORDER — ONDANSETRON HCL 4 MG/2ML IJ SOLN: 4.0000 mg | INTRAMUSCULAR | Status: DC | PRN

## 2014-09-02 MED ORDER — ACETAMINOPHEN 325 MG PO TABS: 650.0000 mg | ORAL_TABLET | ORAL | Status: DC | PRN

## 2014-09-02 MED ORDER — SENNOSIDES-DOCUSATE SODIUM 8.6-50 MG PO TABS
2.0000 | ORAL_TABLET | ORAL | Status: DC
  Administered 2014-09-02: 2 via ORAL
  Filled 2014-09-02 (×2): qty 2

## 2014-09-02 MED ORDER — MISOPROSTOL 25 MCG QUARTER TABLET
800.0000 ug | ORAL_TABLET | Freq: Once | ORAL | Status: AC
  Administered 2014-09-02: 800 ug via VAGINAL

## 2014-09-02 NOTE — Progress Notes (Addendum)
Patient ID: Joanne PaceSheree L Huff, female   DOB: [birth year], 24 y.o.   MRN: [id number] Doing well.  Pitocin back on now, so contractions have slowed to every 5 min FHR reassuring Dilation: 6 Effacement (%): 100 Cervical Position: Anterior Station: +1 Presentation: Vertex Exam by:: Mayford KnifeWilliams CNM  Will continue to increase Pitocin

## 2014-09-02 NOTE — Progress Notes (Signed)
Patient ID: Joanne PaceSheree L Huff, female   DOB: [birth year], 24 y.o.   MRN: [id number] FHR exhibited some decels  Consultation with Dr Adrian BlackwaterStinson by RN >> Pitocin turned off to rest baby  FHR now improved with accels and no decels  Dilation: 5.5 Effacement (%): 90 Cervical Position: Middle Station: -1, 0 Presentation: Vertex Exam by:: Wiliams CNM  Pitocin turned back on

## 2014-09-02 NOTE — Progress Notes (Signed)
Pt eating

## 2014-09-02 NOTE — Anesthesia Procedure Notes (Signed)
Epidural Patient location during procedure: OB  Staffing Anesthesiologist: Davonn Flanery, CHRIS Performed by: anesthesiologist   Preanesthetic Checklist Completed: patient identified, surgical consent, pre-op evaluation, timeout performed, IV checked, risks and benefits discussed and monitors and equipment checked  Epidural Patient position: sitting Prep: site prepped and draped and DuraPrep Patient monitoring: heart rate, cardiac monitor, continuous pulse ox and blood pressure Approach: midline Location: L4-L5 Injection technique: LOR saline  Needle:  Needle type: Tuohy  Needle gauge: 17 G Needle length: 9 cm Needle insertion depth: 9 cm Catheter type: closed end flexible Catheter size: 19 Gauge Catheter at skin depth: 15 cm Test dose: negative and 2% lidocaine with Epi 1:200 K  Assessment Events: blood not aspirated, injection not painful, no injection resistance, negative IV test and no paresthesia  Additional Notes H+P and labs checked, risks and benefits discussed with the patient, consent obtained, procedure tolerated well and without complications.  Reason for block:procedure for pain   

## 2014-09-02 NOTE — Progress Notes (Signed)
IFSE removed with tip intact 

## 2014-09-02 NOTE — Progress Notes (Signed)
Elanora L Charyl DancerGant is a 24 y.o. G1P0 at 8375w4d by ultrasound admitted for induction of labor due to Pre-eclamptic toxemia of pregnancy.  Subjective: Doing well. Wanting to have the baby. Pain is controlled with epidural states she can still feel the contractions some.   Objective: BP 133/67 mmHg  Pulse 78  Temp(Src) 98.8 F (37.1 C) (Oral)  Resp 16  Ht 5\' 9"  (1.753 m)  Wt 255 lb (115.667 kg)  BMI 37.64 kg/m2  LMP 12/05/2013     FHT:  FHR: 125 bpm, variability: moderate,  accelerations:  Present,  decelerations:  Absent UC:   regular, every 3-5 minutes SVE: 8/100/+1    Labs: Lab Results  Component Value Date   WBC 10.0 09/01/2014   HGB 11.5* 09/01/2014   HCT 33.6* 09/01/2014   MCV 85.7 09/01/2014   PLT 284 09/01/2014    Assessment / Plan: Induction of labor due to preeclampsia,  progressing well on pitocin  Labor: Progressing normally, expectant managment Preeclampsia:  no signs or symptoms of toxicity Fetal Wellbeing:  Category I and Category II Pain Control:  Epidural Anticipated MOD:  NSVD   Caryl AdaJazma Audric Venn, DO 09/02/2014, 11:11 AM PGY-1, [facility]

## 2014-09-02 NOTE — Progress Notes (Signed)
Patient ID: Brooke PaceSheree L Deramo, female   DOB: 07/27/1990, 24 y.o.   MRN: [id number] Now requesting epidural  Filed Vitals:   09/01/14 2201 09/01/14 2242 09/01/14 2301 09/01/14 2331  BP: 142/87 154/88 155/89 133/61  Pulse: 62 54 53 66  Temp:   98.3 F (36.8 C)   TempSrc:   Oral   Resp: 20  20 20   Height:      Weight:       FHR stable with variable decels with UCs  Dilation: 5.5 Effacement (%): 90 Cervical Position: Middle Station: -1, 0 Presentation: Vertex Exam by:: Wiliams CNM  Will get epidural and recheck in 1-2 hours

## 2014-09-02 NOTE — Anesthesia Preprocedure Evaluation (Signed)
Anesthesia Evaluation  Patient identified by MRN, date of birth, ID band Patient awake    Reviewed: Allergy & Precautions, NPO status , Patient's Chart, lab work & pertinent test results  History of Anesthesia Complications Negative for: history of anesthetic complications  Airway Mallampati: III  TM Distance: >3 FB Neck ROM: Full    Dental  (+) Teeth Intact   Pulmonary neg shortness of breath, neg sleep apnea, neg COPDneg recent URI, former smoker,  breath sounds clear to auscultation        Cardiovascular hypertension, Rhythm:Regular     Neuro/Psych negative neurological ROS  negative psych ROS   GI/Hepatic negative GI ROS, Neg liver ROS,   Endo/Other  Morbid obesity  Renal/GU negative Renal ROS     Musculoskeletal   Abdominal   Peds  Hematology  (+) anemia ,   Anesthesia Other Findings   Reproductive/Obstetrics (+) Pregnancy                             Anesthesia Physical Anesthesia Plan  ASA: II  Anesthesia Plan: Epidural   Post-op Pain Management:    Induction:   Airway Management Planned:   Additional Equipment:   Intra-op Plan:   Post-operative Plan:   Informed Consent: I have reviewed the patients History and Physical, chart, labs and discussed the procedure including the risks, benefits and alternatives for the proposed anesthesia with the patient or authorized representative who has indicated his/her understanding and acceptance.     Plan Discussed with: Anesthesiologist  Anesthesia Plan Comments:         Anesthesia Quick Evaluation

## 2014-09-03 NOTE — Anesthesia Postprocedure Evaluation (Signed)
Anesthesia Post Note  Patient: Joanne PaceSheree L Huff  Procedure(s) Performed: * No procedures listed *  Anesthesia type: Epidural  Patient location: Mother/Baby  Post pain: Pain level controlled  Post assessment: Post-op Vital signs reviewed  Last Vitals:  Filed Vitals:   09/03/14 0650  BP: 136/83  Pulse: 66  Temp: 36.8 C  Resp: 18    Post vital signs: Reviewed  Level of consciousness:alert  Complications: No apparent anesthesia complications

## 2014-09-03 NOTE — Clinical Social Work Maternal (Signed)
CLINICAL SOCIAL WORK MATERNAL/CHILD NOTE  Patient Details  Name: Joanne Huff MRN: [id number] Date of Birth: 10/31/1990  Date:  09/03/2014  Clinical Social Worker Initiating Note:  Marsa Matteo E. Brigitte Pulse, [place] Date/ Time Initiated:  09/03/14/1530     Child's Name:  Roberts Gaudy   Legal Guardian:   (Parents: Geryl Rankins and Carey Bullocks)   Need for Interpreter:  None   Date of Referral:  09/02/14     Reason for Referral:  Current Substance Use/Substance Use During Pregnancy    Referral Source:  [facility]   Address:  [address] [place], [place] [address]  Phone number:  [phone]   Household Members:  Significant Other   Natural Supports (not living in the home):  Extended Family, Friends   Professional Supports:     Employment:     Type of Work:  (MOB states she was let go from [employer] due to being pregnant (although they did not say it was due to her pregnancy).  FOB works at [employer].)   Education:      Pensions consultant:  [employer]   Other Resources:  [facility]   Cultural/Religious Considerations Which May Impact Care:  None stated  Strengths:  Ability to meet basic needs , Home prepared for child    Risk Factors/Current Problems:  Substance Use    Cognitive State:  Alert , Insightful , Linear Thinking    Mood/Affect:  Bright , Happy , Relaxed    CSW Assessment: CSW met with MOB in her first floor room/141 to complete assessment due to marijuana use.  FOB was in the room when CSW arrived and when asked if MOB would like to speak privately, she asked FOB to step out and he did so willingly.   CSW discussed MOB's feelings about becoming a mother, how her labor and delivery went and how she is feeling emotionally at this time.  MOB appeared calm and relaxed with baby and bonding is evident.  She reports feeling happy about becoming a mother and states labor and delivery went well.  She states she and FOB are in a relationship and that he is  involved and supportive.  She reports that they live together.  She states her mother and his god-mother are their greatest support people.  She states they have everything they need for baby at home.   CSW provided education on safe sleep as well as PPD signs and symptoms and encouraged MOB to talk with her OB or primary care doctor if she has concerns about her emotions at any time.  MOB was attentive and agreeable.   CSW inquired about MOB's documented marijuana use and MOB became tearful.  She states extreme guilt over her use, which she states was only 3 time, all within the last month of pregnancy.  She states she was having trouble eating/keeping food down and just wanted to make sure she was eating so that her baby was growing.  She states she is "beating herself up over now."  CSW explained hospital drug screen policy and that baby's UDS was positive, meaning a mandatory report to Child Protective Services.  CSW explained that CSW's concern is also for her and to ensure that she does not feel like she has an addiction to marijuana.  MOB denies and states she had not used in years prior to feeling like she needed it as an appetite aid.  MOB was understanding of the policy and need for report.  CSW  thanked her for her honesty and informed her that she will be hearing from a CPS worker once she gets home, as this is not a reason to delay discharge of baby.  CSW Plan/Description:  Patient/Family Education , No Further Intervention Required/No Barriers to Discharge, Child Protective Service Report     Kalman Shan 09/03/2014, 5:26 PM

## 2014-09-03 NOTE — Progress Notes (Signed)
Post Partum Day 1  Subjective:  Joanne Huff is a 24 y.o. G1P1001 10432w5d s/p NSVD.  No acute events overnight.  Pt denies problems with ambulating, voiding or po intake.  She denies nausea or vomiting.  Pain is moderately controlled.  She has had flatus. She has not had bowel movement.  Lochia Small.  Plan for birth control is condoms.  Method of Feeding: Bottle.  Doing well this morning with no complaints  Objective: BP 136/83 mmHg  Pulse 66  Temp(Src) 98.3 F (36.8 C) (Oral)  Resp 18  Ht 5\' 9"  (1.753 m)  Wt 255 lb (115.667 kg)  BMI 37.64 kg/m2  SpO2 100%  LMP 12/05/2013  Breastfeeding? Unknown  Physical Exam:  General: alert, cooperative and no distress Lochia:normal flow Chest: CTAB Heart: RRR no m/r/g Abdomen: +BS, soft, nontender, fundus firm /below umbilicus DVT Evaluation: No evidence of DVT seen on physical exam. Extremities: no edema   Recent Labs  09/01/14 2345 09/02/14 1344  HGB 11.5* 10.1*  HCT 33.6* 30.0*    Assessment/Plan:  ASSESSMENT: Joanne Huff is a 24 y.o. G1P1001 7432w5d ppd #1 s/p NSVD doing well. Pt had IOL 2/2 preE with mild fx.  Elevated BPs - Continue to monitor. May need to start on BP medication.    Plan for discharge tomorrow   LOS: 2 days   Caryl AdaJazma Kaylina Cahue, DO 09/03/2014, 11:50 AM PGY-1, [facility]

## 2014-09-04 MED ORDER — IBUPROFEN 600 MG PO TABS: 600.0000 mg | ORAL_TABLET | Freq: Four times a day (QID) | ORAL | Status: DC

## 2014-09-04 NOTE — Discharge Instructions (Signed)

## 2014-09-04 NOTE — Discharge Summary (Signed)
  Obstetric Discharge Summary Reason for Admission: IOL for mild preeclampsia Prenatal Procedures: NST and ultrasound Intrapartum Procedures: spontaneous vaginal delivery Postpartum Procedures: none Complications-Operative and Postpartum: none  Hospital Course: Went on to have SVD without complications. BP have been 132-136/63-83 last 24 hours.  Plans condoms for birth control and is bottle feeding.   Delivery Note At 12:38 PM a viable female was delivered via Vaginal, Spontaneous Delivery (Presentation: ; Occiput Anterior).  APGAR: 8, 9; weight 6 lb 6.3 oz (2900 g).   Placenta status: Intact, Spontaneous.  Cord: 3 vessels with the following complications: None.     H/H: Lab Results  Component Value Date/Time   HGB 10.1* 09/02/2014 01:44 PM   HCT 30.0* 09/02/2014 01:44 PM      Discharge Diagnoses: Term Pregnancy-delivered and Preelampsia  Discharge Information: Date: 10/27/2010 Activity: pelvic rest Diet: routine  Medications: None Breast feeding:  No:  Condition: improved Instructions: refer to handout Discharge to: home  Will plan Baby Love nurse this week to check blood pressure.       Medication List    STOP taking these medications        [employer] PRENATAL GUMMY PO     ondansetron 8 MG disintegrating tablet  Commonly known as:  ZOFRAN ODT     promethazine 25 MG tablet  Commonly known as:  PHENERGAN     ranitidine 150 MG tablet  Commonly known as:  ZANTAC     valACYclovir 500 MG tablet  Commonly known as:  VALTREX      TAKE these medications        ibuprofen 600 MG tablet  Commonly known as:  ADVIL,MOTRIN  Take 1 tablet (600 mg total) by mouth every 6 (six) hours.           Follow-up Information    [facility] [place]. Schedule an appointment as soon as possible for a visit in 6 weeks.   Why:  for post partum check up   Contact information:   [address] [facility] [place] [phone]       CRESENZO-DISHMAN,Yoav Okane 09/04/2014,6:45 AM

## 2015-09-07 ENCOUNTER — Encounter (HOSPITAL_COMMUNITY): Payer: Self-pay | Admitting: Emergency Medicine

## 2015-09-07 ENCOUNTER — Ambulatory Visit (HOSPITAL_COMMUNITY)
Admission: EM | Admit: 2015-09-07 | Discharge: 2015-09-07 | Disposition: A | Discharge status: Home / Self Care | Payer: Medicaid Other | Attending: Family Medicine | Admitting: Family Medicine

## 2015-09-07 ENCOUNTER — Ambulatory Visit (INDEPENDENT_AMBULATORY_CARE_PROVIDER_SITE_OTHER): Payer: Self-pay

## 2015-09-07 DIAGNOSIS — S91331A Puncture wound without foreign body, right foot, initial encounter: Secondary | ICD-10-CM

## 2015-09-07 MED ORDER — CEPHALEXIN 500 MG PO CAPS: 500.0000 mg | ORAL_CAPSULE | Freq: Four times a day (QID) | ORAL | Status: DC

## 2015-09-07 MED ORDER — TETANUS-DIPHTH-ACELL PERTUSSIS 5-2.5-18.5 LF-MCG/0.5 IM SUSP
0.5000 mL | Freq: Once | INTRAMUSCULAR | Status: AC
  Administered 2015-09-07: 0.5 mL via INTRAMUSCULAR

## 2015-09-07 MED ORDER — TETANUS-DIPHTH-ACELL PERTUSSIS 5-2.5-18.5 LF-MCG/0.5 IM SUSP
INTRAMUSCULAR | Status: AC
  Filled 2015-09-07: qty 0.5

## 2015-09-07 NOTE — ED Notes (Signed)
C/o right foot injury Stepped on a rusty nail yesterday while prepping for her son's birthday party Need work note States unknown last tetanus shot

## 2015-09-07 NOTE — Discharge Instructions (Signed)
Puncture Wound °A puncture wound is an injury that is caused by a sharp, thin object that goes through your skin, such as a nail. A puncture wound usually does not leave a large opening in your skin, so it may not bleed a lot. However, when you get a puncture wound, dirt or other materials (foreign bodies) can be forced into your wound and break off inside. This makes it more likely that an infection will happen, such as tetanus. °HOME CARE °Medicines  °· Take or apply over-the-counter and prescription medicines only as told by your doctor. °· If you were prescribed an antibiotic medicine, take or apply it as told by your doctor. Do not stop using the antibiotic even if your condition starts to get better. °Wound Care  °· There are many ways to close and cover a wound. For example, a wound can be covered with stitches (sutures), skin glue, or adhesive strips. Follow instructions from your doctor about: °¨ How to take care of your wound. °¨ When and how you should change your bandage (dressing). °¨ When you should remove your bandage. °¨ Removing whatever was used to close your wound. °· Keep the bandage dry as told by your doctor. Do not take baths, swim, use a hot tub, or do anything that would put your wound underwater until your doctor says it is okay. °· Clean the wound as told by your doctor. °· Do not scratch or pick at the wound. °· Check your wound every day for signs of infection. Watch for: °¨ Redness, swelling, or pain. °¨ Fluid, blood, or pus. °General Instructions  °· Raise (elevate) the injured area above the level of your heart while you are sitting or lying down. °· If your puncture wound is in your foot, ask your doctor if you need to avoid putting weight on your foot and for how long. °· Keep all follow-up visits as told by your doctor. This is important. °GET HELP IF: °· You got a tetanus shot and you have any of these problems at the injection site: °¨ Swelling. °¨ Very bad  pain. °¨ Redness. °¨ Bleeding. °· You have a fever. °· Your stitches come out. °· You notice a bad smell coming from your wound or your bandage. °· You notice something coming out of the wound, such as wood or glass. °· Medicine does not help your pain. °· You have more redness, swelling, or pain at the site of your wound. °· You have fluid, blood, or pus coming from your wound. °· You notice a change in the color of your skin near your wound. °· You need to change the bandage often because fluid, blood, or pus is coming from the wound. °· You start to have a new rash. °· You start to have numbness around the wound. °GET HELP RIGHT AWAY IF: °· You have very bad swelling around the wound. °· Your pain suddenly gets worse and is very bad. °· You start to get painful skin lumps. °· You have a red streak going away from your wound. °· The wound is on your hand or foot and you cannot move a finger or toe like you usually can. °· The wound is on your hand or foot and you notice that your fingers or toes look pale or bluish. °  °This information is not intended to replace advice given to you by your health care provider. Make sure you discuss any questions you have with your health care provider. °  °Document   Released: 01/11/2008 Document Revised: 12/23/2014 Document Reviewed: 05/27/2014 °Elsevier Interactive Patient Education ©2016 [employer] Inc[employer] ° °

## 2015-09-07 NOTE — ED Provider Notes (Signed)
CSN: [id number]     Arrival date & time 09/07/15  1305 History   First MD Initiated Contact with Patient 09/07/15 1429     Chief Complaint  Patient presents with  . Foot Injury   (Consider location/radiation/quality/duration/timing/severity/associated sxs/prior Treatment) HPI History obtained from patient: Location: Right foot  Context/Duration: Stepped on nail yesterday  Severity: 3  Quality: Aching Timing:       Constant     Home Treatment: None Associated symptoms:  Painful to walk Family History: Mother-hypertension    Past Medical History  Diagnosis Date  . HSV-1 infection   . Chlamydia   . Trichimoniasis   . Smoker   . Marijuana use   . Ovarian cyst   . High risk HPV infection    Past Surgical History  Procedure Laterality Date  . No past surgeries     Family History  Problem Relation Age of Onset  . Hypertension Mother   . Stroke Mother   . Arthritis Father   . Hypertension Maternal Grandmother   . Diabetes Paternal Grandmother    Social History  Substance Use Topics  . Smoking status: Former Smoker -- 0.50 packs/day for 2 years    Types: Cigarettes    Quit date: 03/02/2014  . Smokeless tobacco: Never Used  . Alcohol Use: No   OB History    Gravida Para Term Preterm AB TAB SAB Ectopic Multiple Living   1 1 1       0 1     Review of Systems  Denies: HEADACHE, NAUSEA, ABDOMINAL PAIN, CHEST PAIN, CONGESTION, DYSURIA, SHORTNESS OF BREATH  Allergies  Review of patient's allergies indicates no known allergies.  Home Medications   Prior to Admission medications   Medication Sig Start Date End Date Taking? Authorizing Provider  ibuprofen (ADVIL,MOTRIN) 600 MG tablet Take 1 tablet (600 mg total) by mouth every 6 (six) hours. 09/04/14   Jacklyn ShellFrances Cresenzo-Dishmon, CNM   Meds Ordered and Administered this Visit  Medications - No data to display  BP 119/58 mmHg  Pulse 60  Temp(Src) 98.4 F (36.9 C) (Oral)  Resp 12  SpO2 100%  LMP 08/07/2015 No data  found.   Physical Exam NURSES NOTES AND VITAL SIGNS REVIEWED. CONSTITUTIONAL: Well developed, well nourished, no acute distress HEENT: normocephalic, atraumatic EYES: Conjunctiva normal NECK:normal ROM, supple, no adenopathy PULMONARY:No respiratory distress, normal effort ABDOMINAL: Soft, ND, NT BS+, No CVAT MUSCULOSKELETAL: Normal ROM of all extremities, PW noted mid section of right foot.  SKIN: warm and dry without rash PSYCHIATRIC: Mood and affect, behavior are normal  ED Course  Procedures (including critical care time)  Labs Review Labs Reviewed - No data to display  Imaging Review Dg Foot Complete Right  09/07/2015  CLINICAL DATA:  Stepped on a rusty nail.  Plantar wound. EXAM: RIGHT FOOT COMPLETE - 3+ VIEW COMPARISON:  None. FINDINGS: No significant bony abnormality. No retained metal foreign body. Based on a BB marker, the puncture site is thought to be below the base of the second metatarsal. No significant soft tissue swelling or gas tracking in the soft tissues. IMPRESSION: 1. Normal exam.  No retained foreign body. Electronically Signed   By: Gaylyn RongWalter  Liebkemann M.D.   On: 09/07/2015 14:52   I HAVE PERSONALLY  REVIEWED AND DISCUSSED RESULTS OF  X-RAYS WITH PATIENT  PRIOR TO DISCHARGE.     Visual Acuity Review  Right Eye Distance:   Left Eye Distance:   Bilateral Distance:    Right Eye Near:  Left Eye Near:    Bilateral Near:         MDM   1. Puncture wound of foot, right, initial encounter     Patient is reassured that there are no issues that require transfer to higher level of care at this time or additional tests. Patient is advised to continue home symptomatic treatment. Patient is advised that if there are new or worsening symptoms to attend the emergency department, contact primary care provider, or return to UC. Instructions of care provided discharged home in stable condition.    THIS NOTE WAS GENERATED USING A VOICE RECOGNITION SOFTWARE  PROGRAM. ALL REASONABLE EFFORTS  WERE MADE TO PROOFREAD THIS DOCUMENT FOR ACCURACY.  I have verbally reviewed the discharge instructions with the patient. A printed AVS was given to the patient.  All questions were answered prior to discharge.      Tharon Aquas, PA 09/07/15 [phone]

## 2015-10-09 IMAGING — US US MFM FETAL NUCHAL TRANSLUCENCY
1 series · 13 of 20 positions shown · non-contrast
Comparison: none

[Series 1: us mfm fetal nuchal translucency · 13 of 20 slices shown]
[im 1/20]
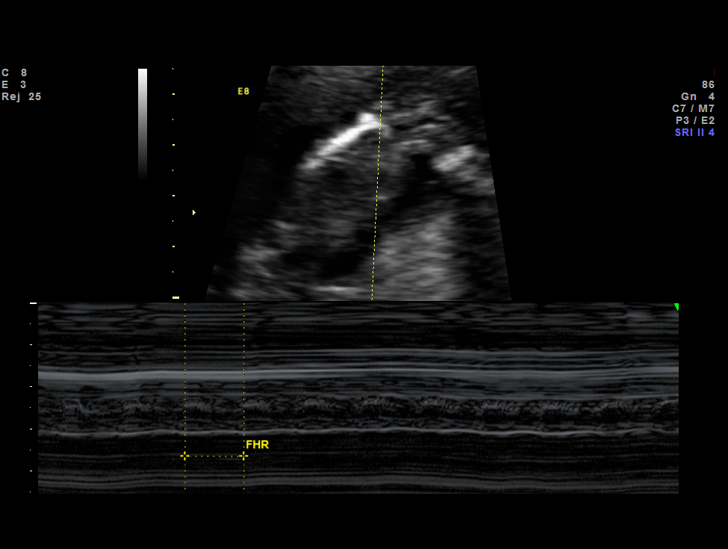
[im 3/20]
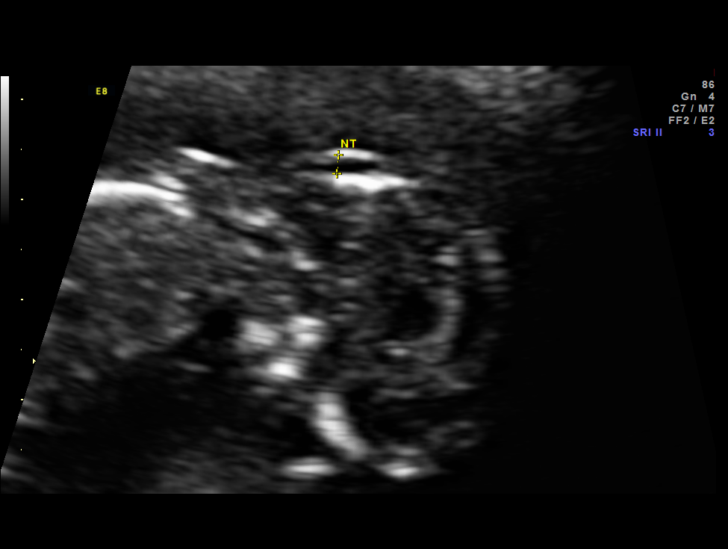
[im 4/20]
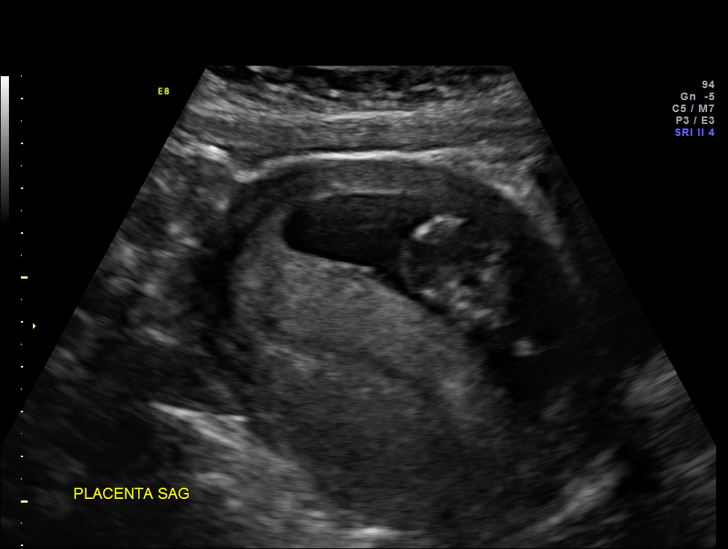
[im 6/20]
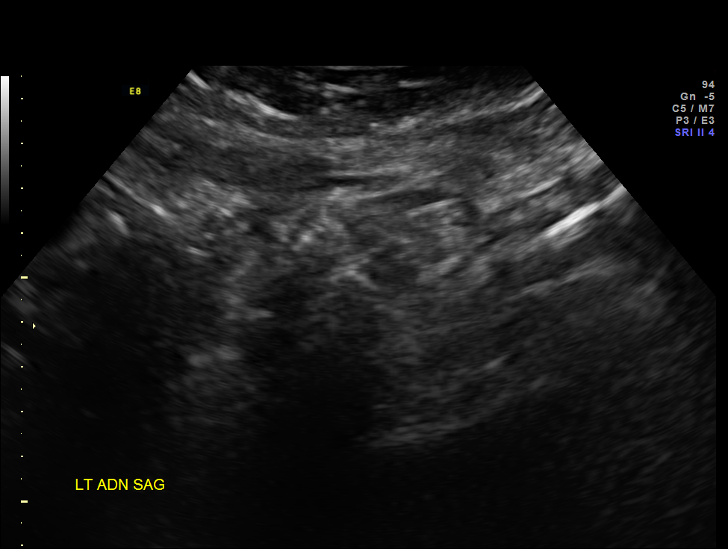
[im 7/20]
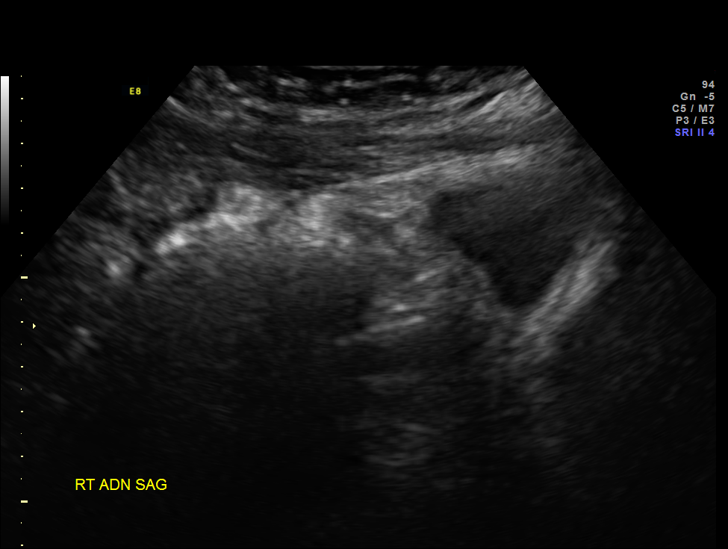
[im 9/20]
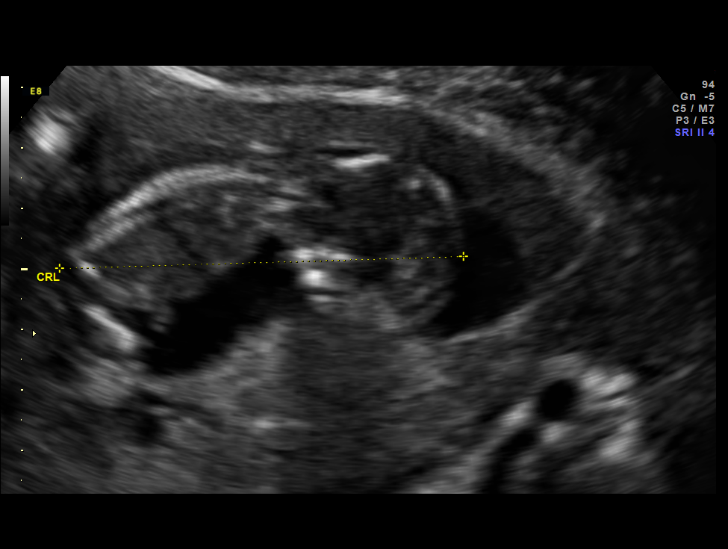
[im 11/20]
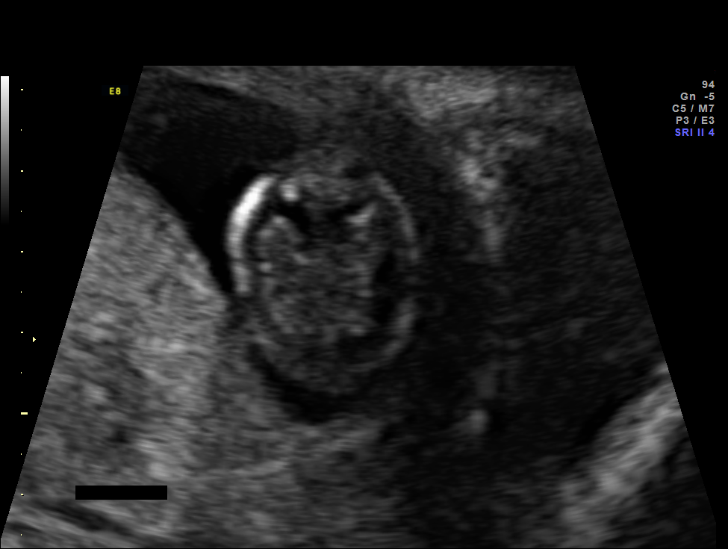
[im 12/20]
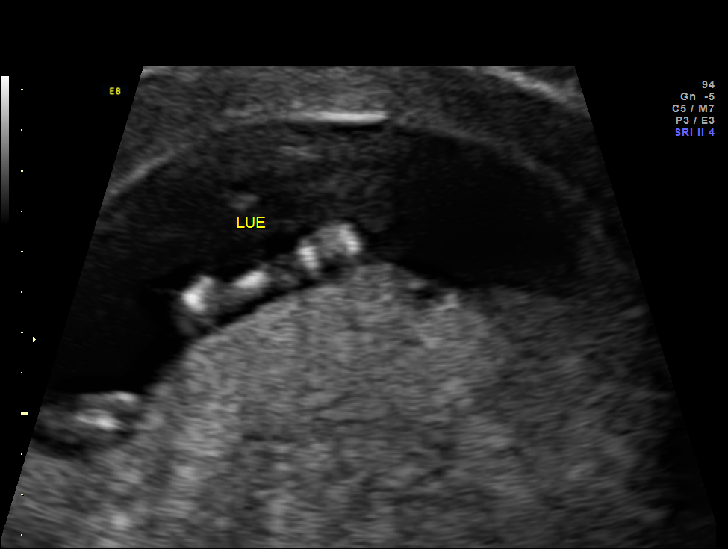
[im 14/20]
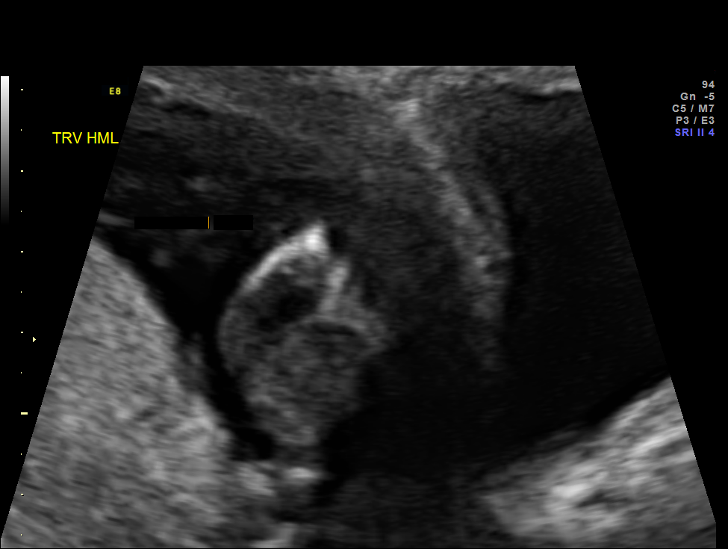
[im 15/20]
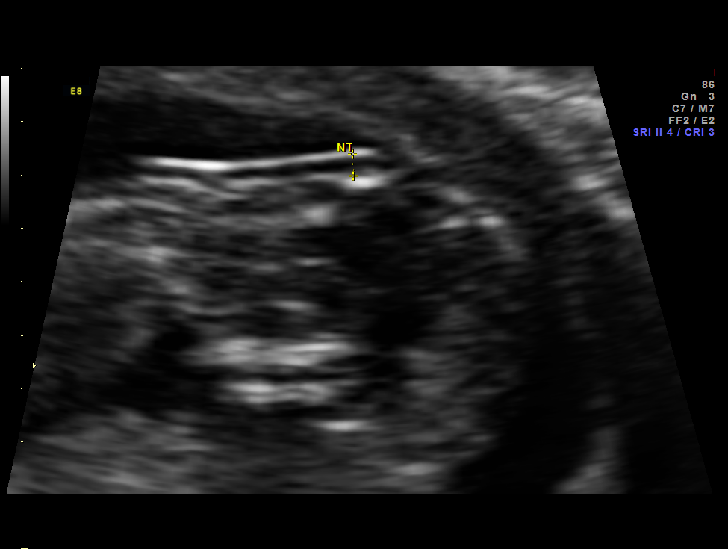
[im 17/20]
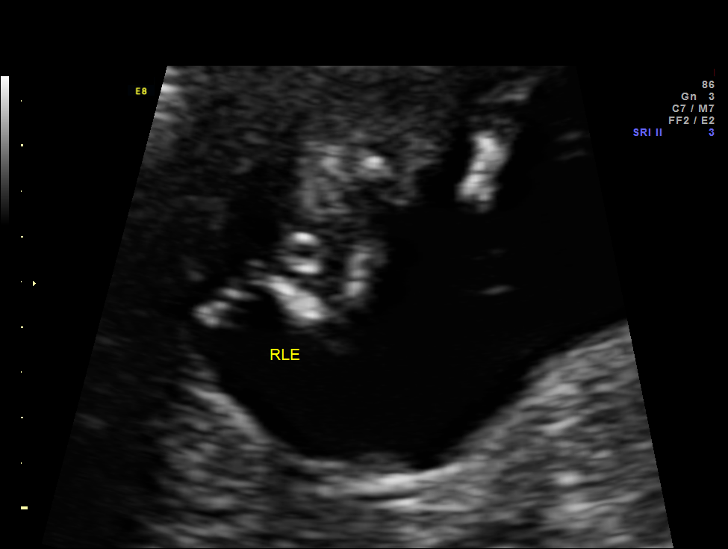
[im 18/20]
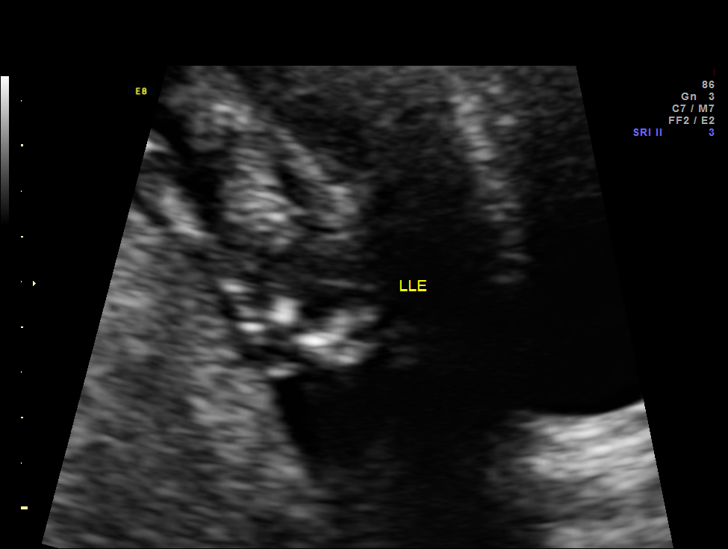
[im 20/20]
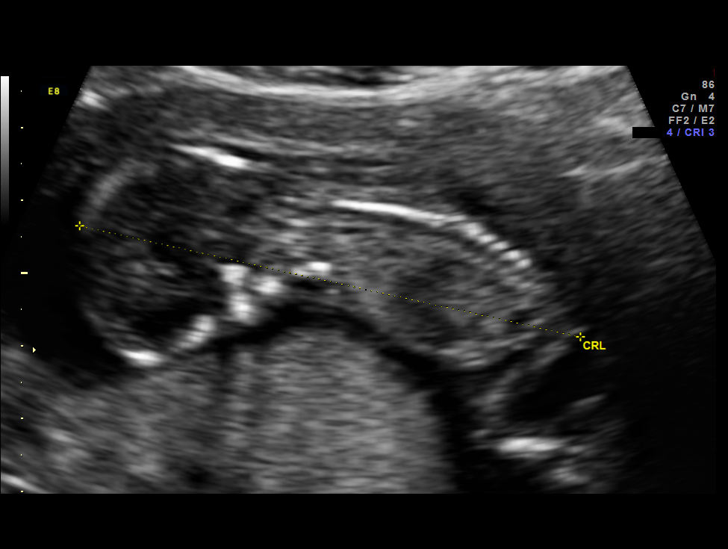

[13 of 20 positions shown; findings below may reference images not displayed]

OBSTETRICS REPORT
                      (Signed Final 03/05/2014 [DATE])

Service(s) Provided

Indications

 First trimester aneuploidy screen (NT)                Z36
 Herpes simplex virus (GIORGI)
 Maternal obesity
Fetal Evaluation

 Num Of Fetuses:    1
 Fetal Heart Rate:  171                          bpm
 Cardiac Activity:  Observed
 Presentation:      Variable
 Placenta:          Posterior

 Amniotic Fluid
 AFI FV:      Subjectively within normal limits
Gestational Age

 LMP:           12w 6d        Date:  12/05/13                 EDD:   09/11/14
 Best:          12w 6d     Det. By:  LMP  (12/05/13)          EDD:   09/11/14
1st Trimester Genetic Sonogram Screening

 CRL:            68.8  mm    G. Age:   13w 0d                 EDD:   09/10/14
 Nuc Trans:       2.0  mm

 Nasal Bone:                 Not well visualized
Anatomy

 Cranium:          Appears normal         Stomach:          Appears normal, left
                                                            sided
 Choroid Plexus:   Appears normal

 Other:  Technically difficult due to fetal position.
Cervix Uterus Adnexa

 Cervix:       Not visualized (advanced GA >81wks)

 Adnexa:     No abnormality visualized.
Impression

 Single IUP at 12w 6d
 Normal NT (2.0 mm).  Nasal bone not visible due to fetal
 position.
 First trimester aneuploidy screen performed as noted above.
Recommendations

 Please do not draw triple/quad screen, though patient should
 be offered MSAFP for neural tube defect screening.
 Recommend ultrasound for fetal anatomy at 18-20 weeks
 gestation

 questions or concerns.

## 2016-03-25 IMAGING — US US FETAL BPP W/O NONSTRESS
1 series · 13 of 16 positions shown · non-contrast
Comparison: none

[Series 1: us fetal bpp w/o nonstress · non-contrast · 16 acquisitions, 13 frames shown]
[im 1/16]
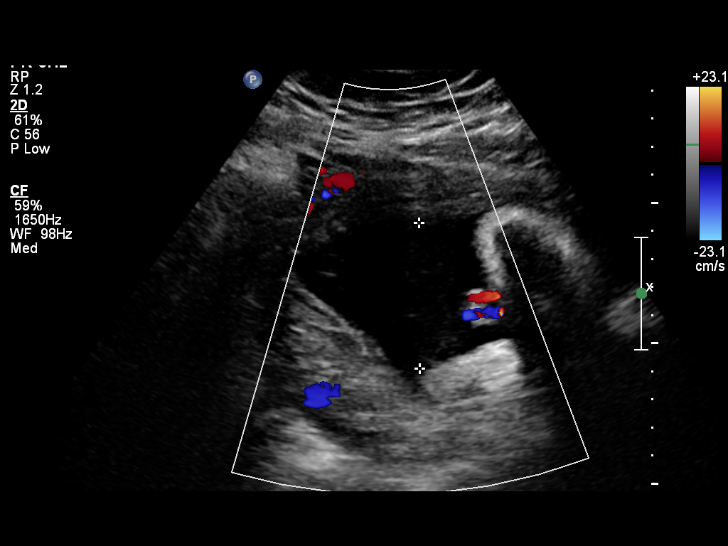
[im 2/16]
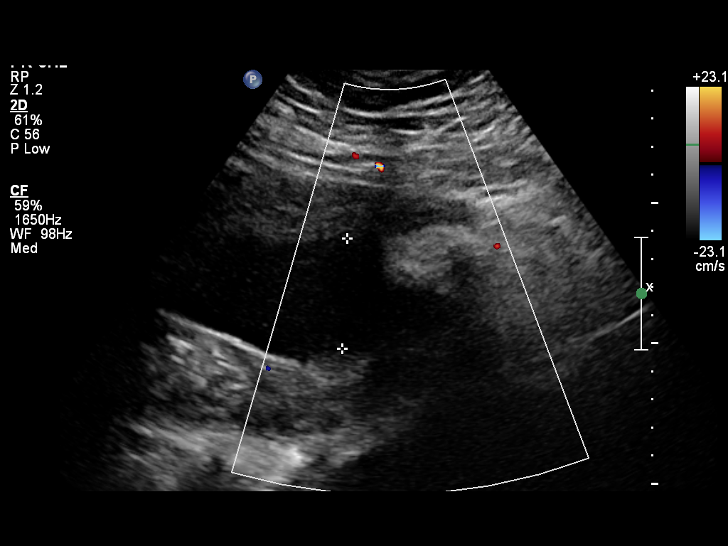
[im 4/16]
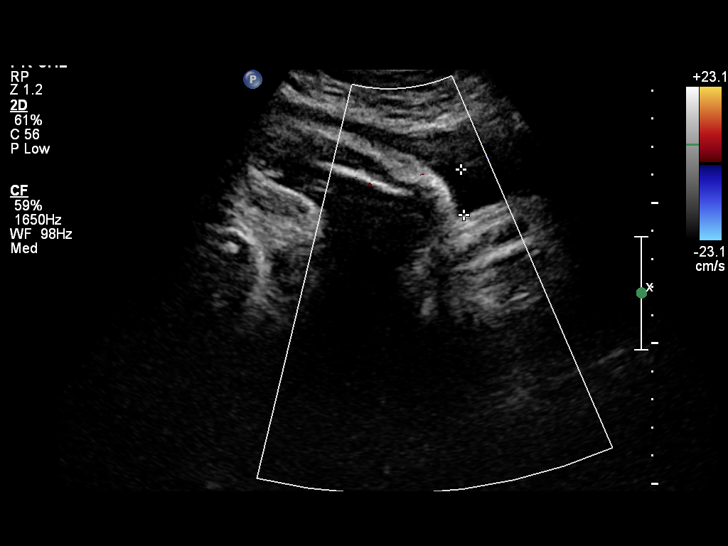
[im 5/16]
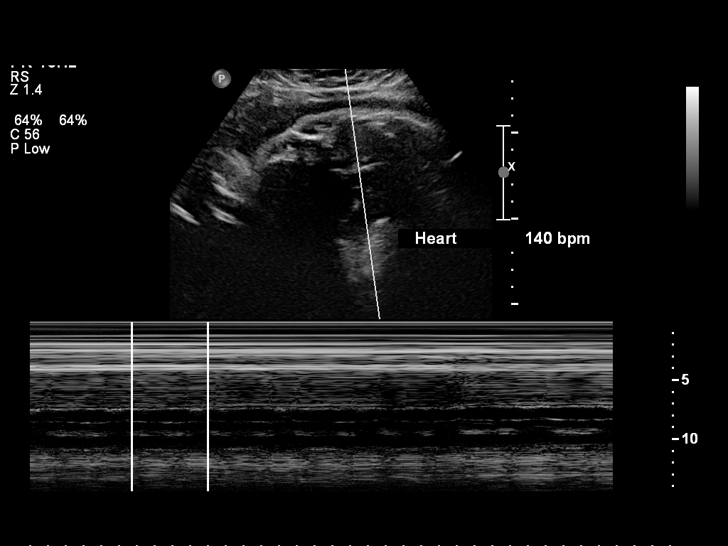
[im 6/16]
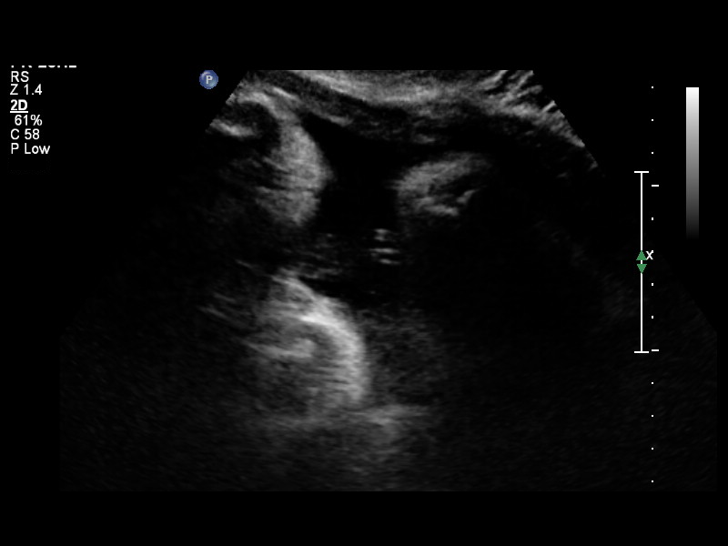
[im 7/16]
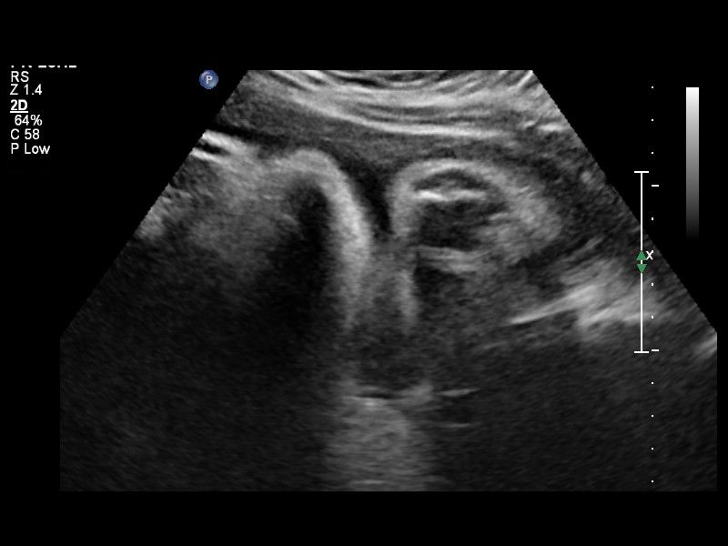
[im 9/16]
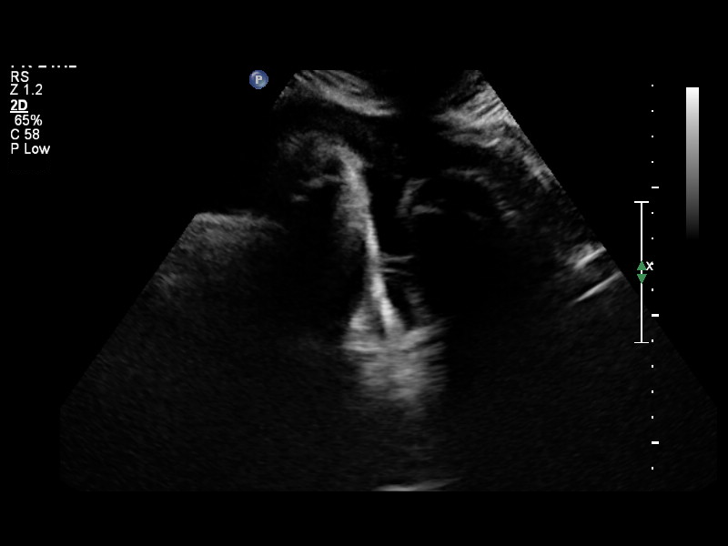
[im 10/16]
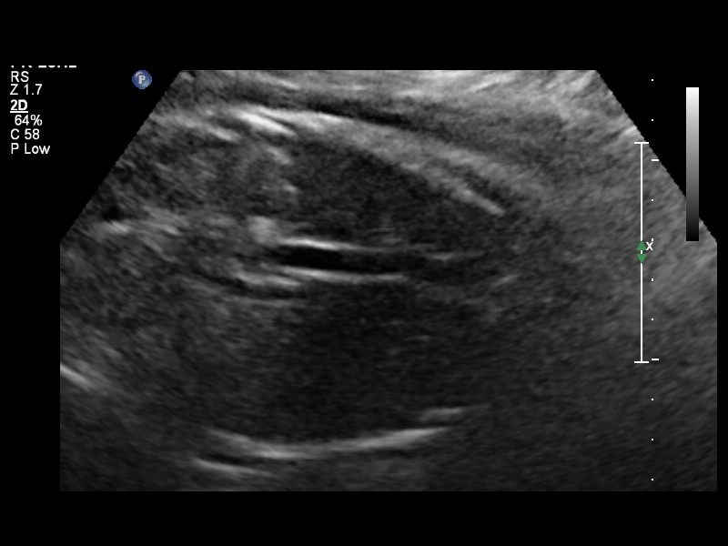
[im 11/16]
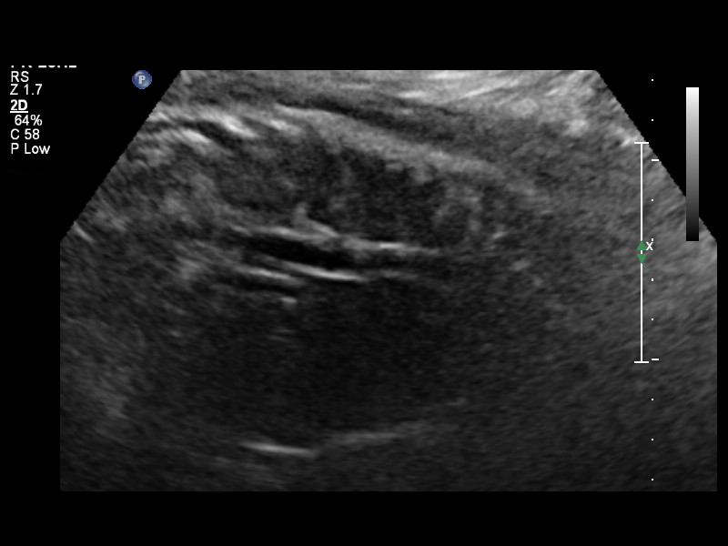
[im 12/16]
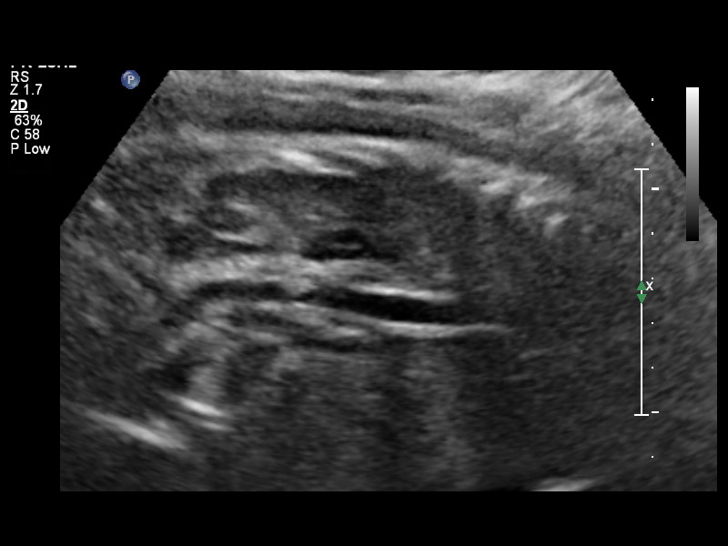
[im 13/16]
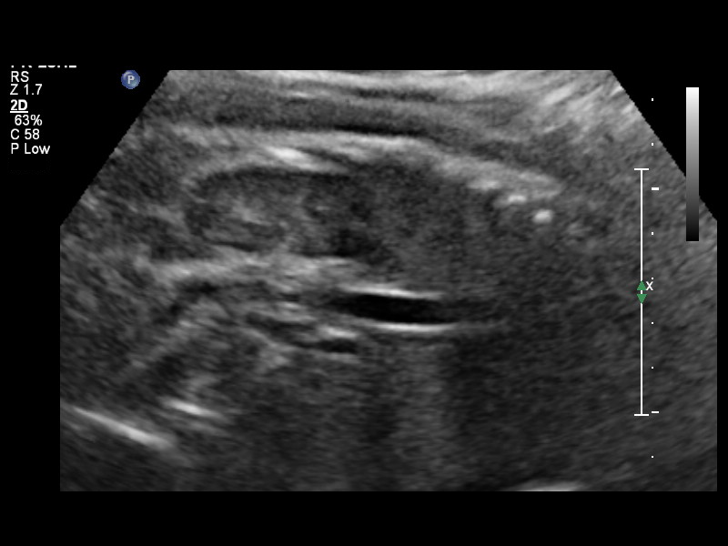
[im 15/16]
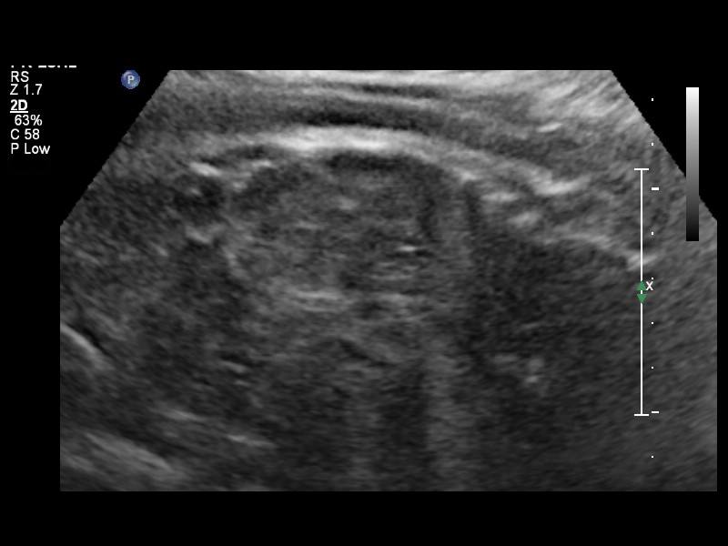
[im 16/16]
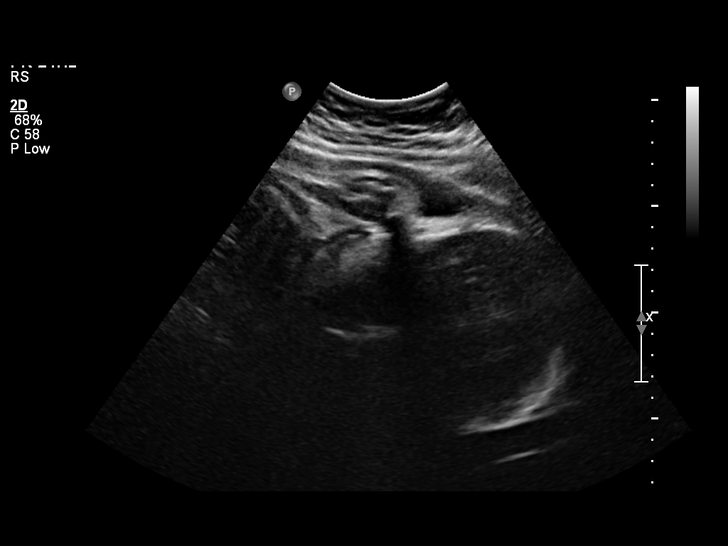

[13 of 16 positions shown; findings below may reference images not displayed]

OBSTETRICS REPORT
(Signed Final 08/21/2014 [DATE])

Service(s) Provided

Indications

Decreased fetal movement
Obesity complicating pregnancy, second trimester
36 weeks gestation of pregnancy
Fetal Evaluation

Num Of Fetuses:    1
Fetal Heart Rate:  140                          bpm
Cardiac Activity:  Observed
Presentation:      Cephalic

Comment:    [DATE] BPP in 12 mins

Amniotic Fluid
AFI FV:      Subjectively within normal limits
AFI Sum:     14.16   cm       53  %Tile     Larg Pckt:    5.16  cm
RUQ:   5.16    cm   RLQ:    3.91   cm    LUQ:   1.61    cm   LLQ:    3.48   cm
Biophysical Evaluation

Amniotic F.V:   Within normal limits       F. Tone:        Observed
F. Movement:    Observed                   Score:          [DATE]
F. Breathing:   Observed
Gestational Age

LMP:           36w 6d        Date:  12/05/13                 EDD:   09/11/14
Best:          36w 6d     Det. By:  LMP  (12/05/13)          EDD:   09/11/14
Impression

23 yo G1 with decreased fetal movement, obesity

1.  Single intrauterine pregnancy at 32w2d
2. Cephalic presentation
3. Normal amniotic fluid
Recommendations

Follow-up ultrasounds as clinically indicated.

questions or concerns.

## 2017-01-24 ENCOUNTER — Ambulatory Visit (HOSPITAL_COMMUNITY)
Admission: EM | Admit: 2017-01-24 | Discharge: 2017-01-24 | Disposition: A | Discharge status: Home / Self Care | Payer: Self-pay | Attending: Family Medicine | Admitting: Family Medicine

## 2017-01-24 ENCOUNTER — Encounter (HOSPITAL_COMMUNITY): Payer: Self-pay | Admitting: Emergency Medicine

## 2017-01-24 DIAGNOSIS — K029 Dental caries, unspecified: Secondary | ICD-10-CM

## 2017-01-24 DIAGNOSIS — K0889 Other specified disorders of teeth and supporting structures: Secondary | ICD-10-CM

## 2017-01-24 MED ORDER — IBUPROFEN 800 MG PO TABS: 800.0000 mg | ORAL_TABLET | Freq: Three times a day (TID) | ORAL | 0 refills | Status: DC

## 2017-01-24 MED ORDER — AMOXICILLIN 500 MG PO CAPS: 1000.0000 mg | ORAL_CAPSULE | Freq: Two times a day (BID) | ORAL | 0 refills | Status: DC

## 2017-01-24 MED ORDER — TRAMADOL HCL 50 MG PO TABS: 50.0000 mg | ORAL_TABLET | Freq: Four times a day (QID) | ORAL | 0 refills | Status: DC | PRN

## 2017-01-24 NOTE — ED Triage Notes (Signed)
The patient presented to the [facility] with a complaint of tooth pain that started this am.

## 2017-01-24 NOTE — ED Provider Notes (Signed)
[facility]    CSN: [id number] Arrival date & time: 01/24/17  1811     History   Chief Complaint Chief Complaint  Patient presents with  . Dental Pain    HPI REEYA BOUND is a 26 y.o. female.   25 year female comes complains of toothache that started today. She states it has a hole in it. She has not taken any medicine for it.      Past Medical History:  Diagnosis Date  . Chlamydia   . High risk HPV infection   . HSV-1 infection   . Marijuana use   . Ovarian cyst   . Smoker   . Trichimoniasis     Patient Active Problem List   Diagnosis Date Noted  . Preeclampsia 09/01/2014    Past Surgical History:  Procedure Laterality Date  . NO PAST SURGERIES      OB History    Gravida Para Term Preterm AB Living   SAB TAB Ectopic Multiple Live Births         0 1       Home Medications    Prior to Admission medications   Medication Sig Start Date End Date Taking? Authorizing Provider  amoxicillin (AMOXIL) 500 MG capsule Take 2 capsules (1,000 mg total) by mouth 2 (two) times daily. 01/24/17   Hayden Rasmussen, NP  ibuprofen (ADVIL,MOTRIN) 800 MG tablet Take 1 tablet (800 mg total) by mouth 3 (three) times daily. 01/24/17   Hayden Rasmussen, NP  traMADol (ULTRAM) 50 MG tablet Take 1 tablet (50 mg total) by mouth every 6 (six) hours as needed. 01/24/17   Hayden Rasmussen, NP    Family History Family History  Problem Relation Age of Onset  . Hypertension Mother   . Stroke Mother   . Arthritis Father   . Hypertension Maternal Grandmother   . Diabetes Paternal Grandmother     Social History Social History  Substance Use Topics  . Smoking status: Former Smoker    Packs/day: 0.50    Years: 2.00    Types: Cigarettes    Quit date: 03/02/2014  . Smokeless tobacco: Never Used  . Alcohol use No     Allergies   Patient has no known allergies.   Review of Systems Review of Systems  Constitutional: Negative.   HENT: Positive for dental  problem.   All other systems reviewed and are negative.    Physical Exam Triage Vital Signs ED Triage Vitals  Enc Vitals Group     BP 01/24/17 1837 123/79     Pulse Rate 01/24/17 1837 63     Resp 01/24/17 1837 18     Temp 01/24/17 1837 97.8 F (36.6 C)     Temp Source 01/24/17 1837 Oral     SpO2 01/24/17 1837 98 %     Weight --      Height --      Head Circumference --      Peak Flow --      Pain Score 01/24/17 1838 9     Pain Loc --      Pain Edu? --      Excl. in GC? --    No data found.   Updated Vital Signs BP 123/79 (BP Location: Left Arm)   Pulse 63   Temp 97.8 F (36.6 C) (Oral)   Resp 18   LMP 01/17/2017 (Exact Date)   SpO2 98%  Visual Acuity Right Eye Distance:   Left Eye Distance:   Bilateral Distance:    Right Eye Near:   Left Eye Near:    Bilateral Near:     Physical Exam  Constitutional: She is oriented to person, place, and time. She appears well-developed and well-nourished. No distress.  HENT:  Mouth/Throat: No oropharyngeal exudate.  All the teeth or the right upper jaw are tender. There is a bicuspid with a denuded area also tender in the source of pain. Mild swelling. No evidence of abscess.  Pulmonary/Chest: Effort normal.  Neurological: She is alert and oriented to person, place, and time.  Skin: Skin is warm and dry.  Nursing note and vitals reviewed.    UC Treatments / Results  Labs (all labs ordered are listed, but only abnormal results are displayed) Labs Reviewed - No data to display  EKG  EKG Interpretation None       Radiology No results found.  Procedures Procedures (including critical care time)  Medications Ordered in UC Medications - No data to display   Initial Impression / Assessment and Plan / UC Course  I have reviewed the triage vital signs and the nursing notes.  Pertinent labs & imaging results that were available during my care of the patient were reviewed by me and considered in my medical  decision making (see chart for details).       Final Clinical Impressions(s) / UC Diagnoses   Final diagnoses:  Pain, dental  Dental caries    New Prescriptions New Prescriptions   AMOXICILLIN (AMOXIL) 500 MG CAPSULE    Take 2 capsules (1,000 mg total) by mouth 2 (two) times daily.   IBUPROFEN (ADVIL,MOTRIN) 800 MG TABLET    Take 1 tablet (800 mg total) by mouth 3 (three) times daily.   TRAMADOL (ULTRAM) 50 MG TABLET    Take 1 tablet (50 mg total) by mouth every 6 (six) hours as needed.     Controlled Substance Prescriptions Enders Controlled Substance Registry consulted? Not Applicable   Hayden Rasmussen, NP 01/24/17 [phone]

## 2017-04-12 IMAGING — DX DG FOOT COMPLETE 3+V*R*
4 series · 4 of 4 positions shown · non-contrast
Comparison: None.

CLINICAL DATA: Stepped on Caren Balde.  Plantar wound.

EXAM:
RIGHT FOOT COMPLETE - 3+ VIEW

[foot ap (1 of 2)]
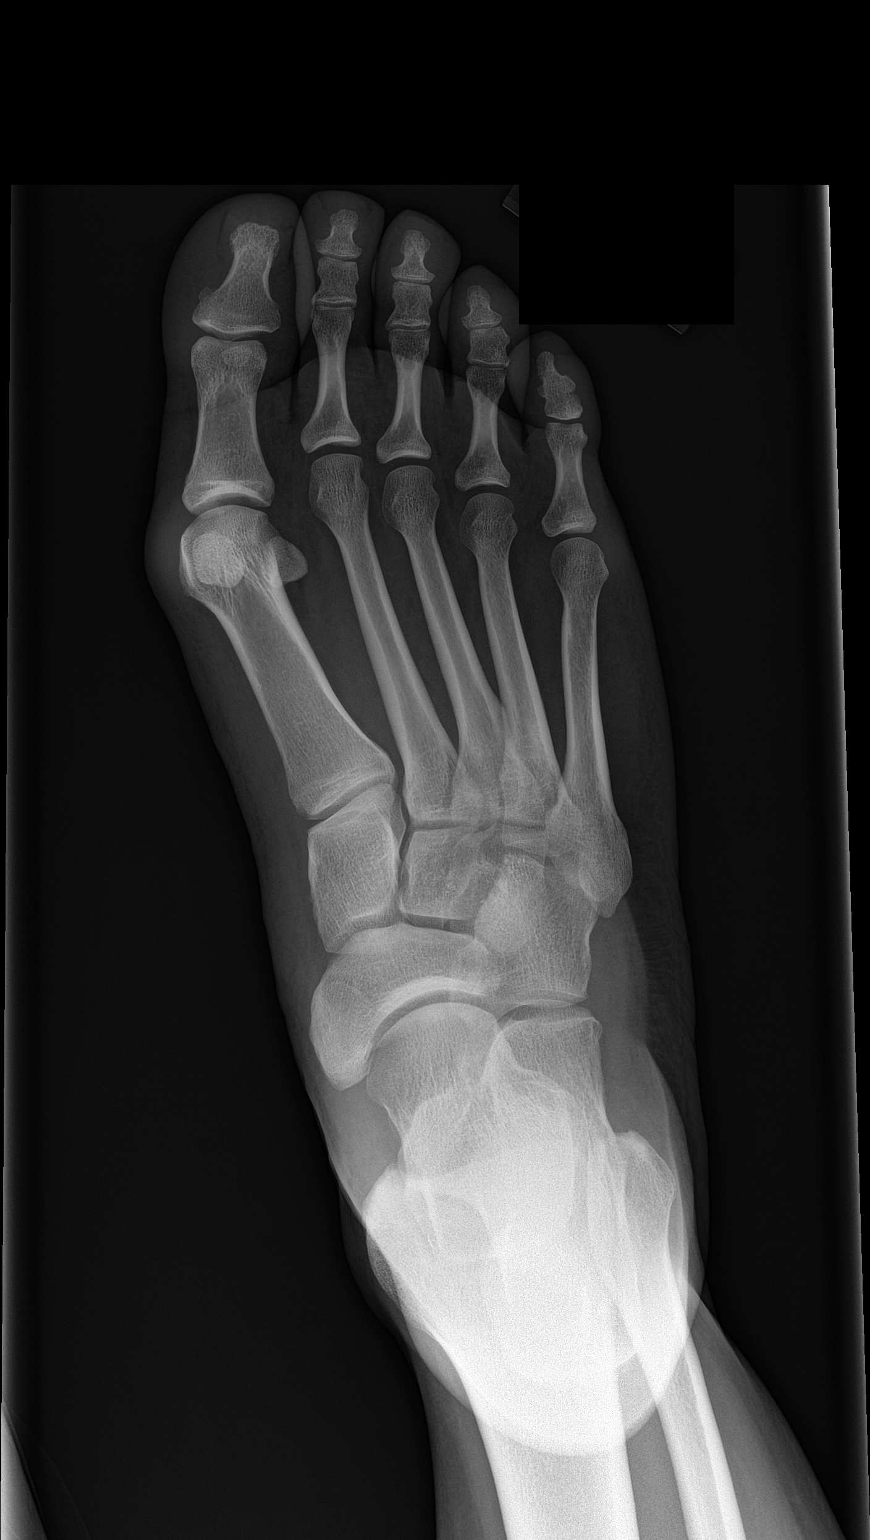

[foot obl]
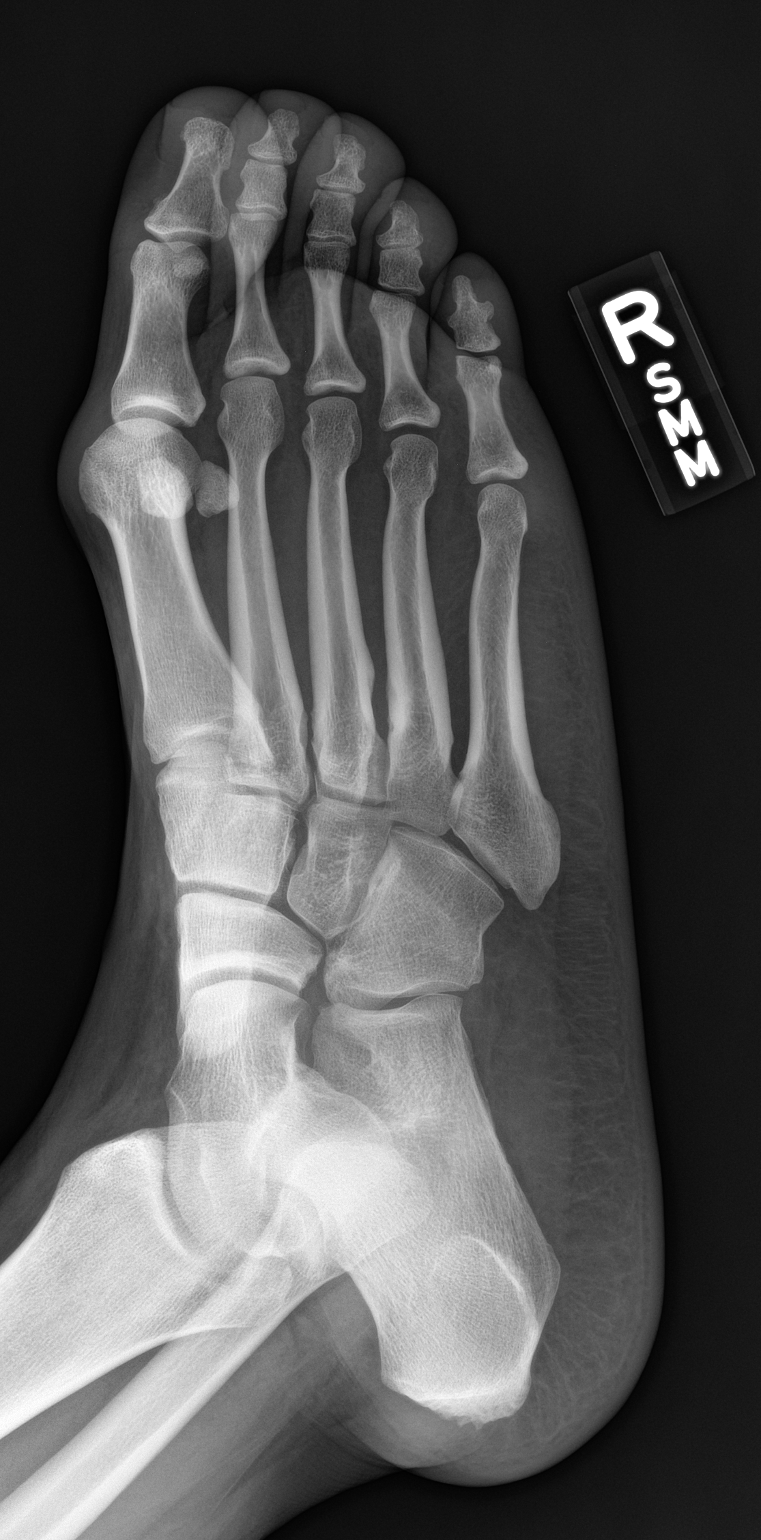

[foot lat]
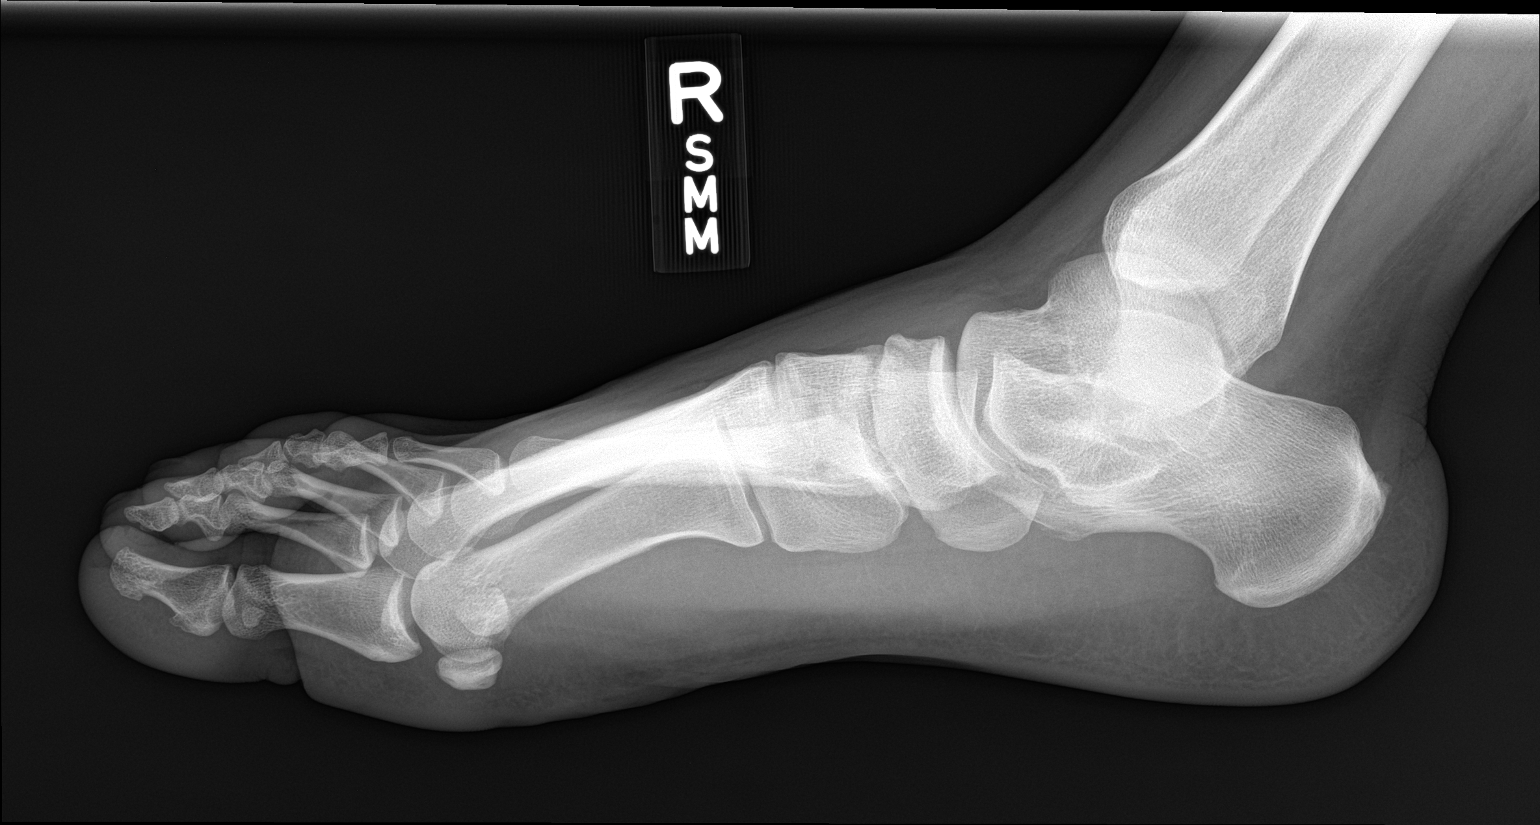

[foot ap (2 of 2)]
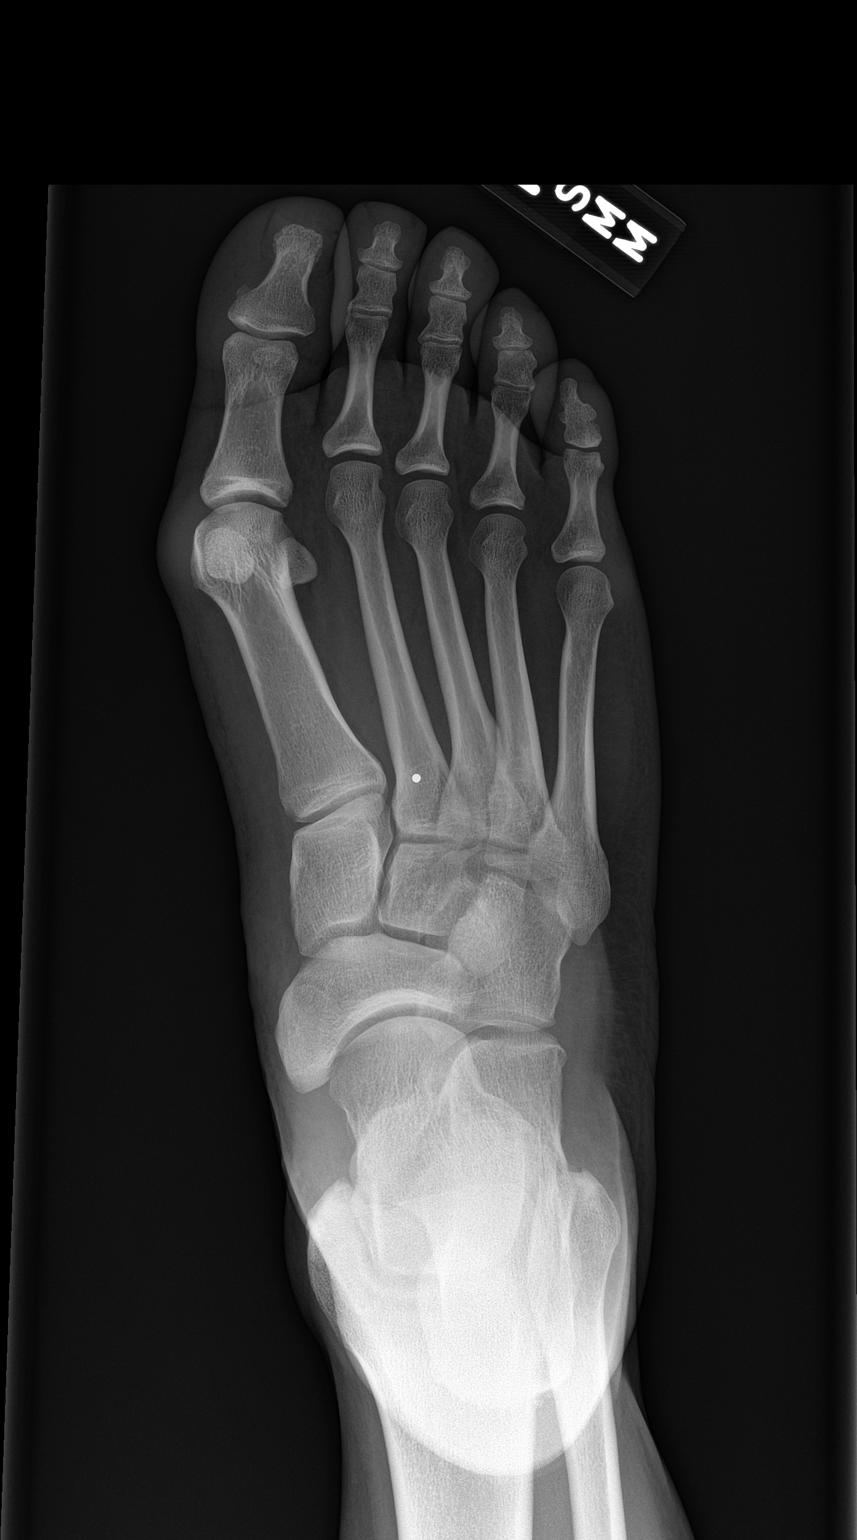

[4 of 4 positions shown; findings below may reference images not displayed]

FINDINGS: No significant bony abnormality. No retained metal foreign body.
Based on a BB marker, the puncture site is thought to be below the
base of the second metatarsal. No significant soft tissue swelling
or gas tracking in the soft tissues.
IMPRESSION: 1. Normal exam.  No retained foreign body.

## 2017-05-30 ENCOUNTER — Ambulatory Visit (HOSPITAL_COMMUNITY)
Admission: EM | Admit: 2017-05-30 | Discharge: 2017-05-30 | Disposition: A | Discharge status: Home / Self Care | Payer: Self-pay | Attending: Family Medicine | Admitting: Family Medicine

## 2017-05-30 ENCOUNTER — Other Ambulatory Visit: Payer: Self-pay

## 2017-05-30 ENCOUNTER — Encounter (HOSPITAL_COMMUNITY): Payer: Self-pay | Admitting: Emergency Medicine

## 2017-05-30 DIAGNOSIS — K529 Noninfective gastroenteritis and colitis, unspecified: Secondary | ICD-10-CM

## 2017-05-30 MED ORDER — ONDANSETRON 8 MG PO TBDP: 8.0000 mg | ORAL_TABLET | Freq: Three times a day (TID) | ORAL | 0 refills | Status: DC | PRN

## 2017-05-30 NOTE — ED Triage Notes (Signed)
Pt c/o vomiting today, woke up this morning vomiting. Pt unable to keep anything down. Denies pain.

## 2017-05-30 NOTE — ED Provider Notes (Signed)
[facility]   [id number] 05/30/17 Arrival Time: 1912   SUBJECTIVE:  Joanne Huff is a 27 y.o. female who presents to the urgent care with complaint of vomiting today, woke up this morning vomiting. Pt unable to keep any solids down but is keeping sips of liquids and ice chips down. Denies pain.   Son had diarrhea a couple days ago.  Past Medical History:  Diagnosis Date  . Chlamydia   . High risk HPV infection   . HSV-1 infection   . Marijuana use   . Ovarian cyst   . Smoker   . Trichimoniasis    Family History  Problem Relation Age of Onset  . Hypertension Mother   . Stroke Mother   . Arthritis Father   . Hypertension Maternal Grandmother   . Diabetes Paternal Grandmother    Social History   Socioeconomic History  . Marital status: Single    Spouse name: Not on file  . Number of children: Not on file  . Years of education: Not on file  . Highest education level: Not on file  Social Needs  . Financial resource strain: Not on file  . Food insecurity - worry: Not on file  . Food insecurity - inability: Not on file  . Transportation needs - medical: Not on file  . Transportation needs - non-medical: Not on file  Occupational History  . Not on file  Tobacco Use  . Smoking status: Former Smoker    Packs/day: 0.50    Years: 2.00    Pack years: 1.00    Types: Cigarettes    Last attempt to quit: 03/02/2014    Years since quitting: 3.2  . Smokeless tobacco: Never Used  Substance and Sexual Activity  . Alcohol use: No  . Drug use: Yes    Types: Marijuana  . Sexual activity: Yes    Birth control/protection: None  Other Topics Concern  . Not on file  Social History Narrative  . Not on file   No outpatient medications have been marked as taking for the 05/30/17 encounter [facility] Encounter).   No Known Allergies    ROS: As per HPI, remainder of ROS negative.   OBJECTIVE:   Vitals:   05/30/17 2007  BP: 130/72  Pulse: 60  Resp: 18  Temp:  98.3 F (36.8 C)  SpO2: 100%     General appearance: alert; no distress Eyes: PERRL; EOMI; conjunctiva normal HENT: normocephalic; atraumatic; ; oral mucosa normal Neck: supple Lungs: clear to auscultation bilaterally Heart: regular rate and rhythm Abdomen: soft, non-tender; bowel sounds normal; no masses or organomegaly; no guarding or rebound tenderness Back: no CVA tenderness Extremities: no cyanosis or edema; symmetrical with no gross deformities Skin: warm and dry Neurologic: normal gait; grossly normal Psychological: alert and cooperative; normal mood and affect      Labs:  Results for orders placed or performed during the hospital encounter of 09/01/14  OB RESULT CONSOLE Group B Strep  Result Value Ref Range   GBS Negative   OB RESULT CONSOLE Group B Strep  Result Value Ref Range   GBS Positive   OB RESULT CONSOLE Group B Strep  Result Value Ref Range   GBS Negative   OB RESULT CONSOLE Group B Strep  Result Value Ref Range   GBS Positive   CBC  Result Value Ref Range   WBC 6.6 4.0 - 10.5 K/uL   RBC 4.16 3.87 - 5.11 MIL/uL   Hemoglobin 12.1 12.0 -  15.0 g/dL   HCT 16.1 09.6 - 04.5 %   MCV 86.5 78.0 - 100.0 fL   MCH 29.1 26.0 - 34.0 pg   MCHC 33.6 30.0 - 36.0 g/dL   RDW 40.9 81.1 - 91.4 %   Platelets 273 150 - 400 K/uL  RPR  Result Value Ref Range   RPR Ser Ql Non Reactive Non Reactive  Comprehensive metabolic panel  Result Value Ref Range   Sodium 134 (L) 135 - 145 mmol/L   Potassium 3.8 3.5 - 5.1 mmol/L   Chloride 105 101 - 111 mmol/L   CO2 20 (L) 22 - 32 mmol/L   Glucose, Bld 71 65 - 99 mg/dL   BUN 9 6 - 20 mg/dL   Creatinine, Ser 7.82 0.44 - 1.00 mg/dL   Calcium 9.1 8.9 - 95.6 mg/dL   Total Protein 6.6 6.5 - 8.1 g/dL   Albumin 3.1 (L) 3.5 - 5.0 g/dL   AST 19 15 - 41 U/L   ALT 15 14 - 54 U/L   Alkaline Phosphatase 156 (H) 38 - 126 U/L   Total Bilirubin 0.3 0.3 - 1.2 mg/dL   GFR calc non Af Amer >60 >60 mL/min   GFR calc Af Amer >60 >60 mL/min    Anion gap 9 5 - 15  Protein / creatinine ratio, urine  Result Value Ref Range   Creatinine, Urine 78.00 mg/dL   Total Protein, Urine 11 mg/dL   Protein Creatinine Ratio 0.14 0.00 - 0.15 mg/mg[Cre]  HIV antibody  Result Value Ref Range   HIV Screen 4th Generation wRfx Non Reactive Non Reactive  OB RESULTS CONSOLE GC/Chlamydia  Result Value Ref Range   Gonorrhea Negative    Chlamydia Negative   OB RESULTS CONSOLE RPR  Result Value Ref Range   RPR Nonreactive   OB RESULTS CONSOLE Rubella Antibody  Result Value Ref Range   Rubella Immune   CBC  Result Value Ref Range   WBC 10.0 4.0 - 10.5 K/uL   RBC 3.92 3.87 - 5.11 MIL/uL   Hemoglobin 11.5 (L) 12.0 - 15.0 g/dL   HCT 21.3 (L) 08.6 - 57.8 %   MCV 85.7 78.0 - 100.0 fL   MCH 29.3 26.0 - 34.0 pg   MCHC 34.2 30.0 - 36.0 g/dL   RDW 46.9 62.9 - 52.8 %   Platelets 284 150 - 400 K/uL  CBC  Result Value Ref Range   WBC 11.2 (H) 4.0 - 10.5 K/uL   RBC 3.49 (L) 3.87 - 5.11 MIL/uL   Hemoglobin 10.1 (L) 12.0 - 15.0 g/dL   HCT 41.3 (L) 24.4 - 01.0 %   MCV 86.0 78.0 - 100.0 fL   MCH 28.9 26.0 - 34.0 pg   MCHC 33.7 30.0 - 36.0 g/dL   RDW 27.2 53.6 - 64.4 %   Platelets 236 150 - 400 K/uL  Type and screen  Result Value Ref Range   ABO/RH(D) A POS    Antibody Screen NEG    Sample Expiration 09/04/2014     Labs Reviewed - No data to display  No results found.     ASSESSMENT & PLAN:  1. Gastroenteritis     Meds ordered this encounter  Medications  . ondansetron (ZOFRAN-ODT) 8 MG disintegrating tablet    Sig: Take 1 tablet (8 mg total) by mouth every 8 (eight) hours as needed for nausea.    Dispense:  12 tablet    Refill:  0    Reviewed expectations re:  course of current medical issues. Questions answered. Outlined signs and symptoms indicating need for more acute intervention. Patient verbalized understanding. After Visit Summary given.    Procedures:      Elvina SidleLauenstein, Allyanna Appleman, MD 05/30/17 2018

## 2017-10-31 ENCOUNTER — Encounter (HOSPITAL_COMMUNITY): Payer: Self-pay | Admitting: Emergency Medicine

## 2017-10-31 ENCOUNTER — Ambulatory Visit (HOSPITAL_COMMUNITY)
Admission: EM | Admit: 2017-10-31 | Discharge: 2017-10-31 | Disposition: A | Discharge status: Home / Self Care | Payer: Medicaid Other | Attending: Internal Medicine | Admitting: Internal Medicine

## 2017-10-31 DIAGNOSIS — K0889 Other specified disorders of teeth and supporting structures: Secondary | ICD-10-CM

## 2017-10-31 DIAGNOSIS — K029 Dental caries, unspecified: Secondary | ICD-10-CM

## 2017-10-31 DIAGNOSIS — K047 Periapical abscess without sinus: Secondary | ICD-10-CM

## 2017-10-31 MED ORDER — KETOROLAC TROMETHAMINE 60 MG/2ML IM SOLN
INTRAMUSCULAR | Status: AC
  Filled 2017-10-31: qty 2

## 2017-10-31 MED ORDER — AMOXICILLIN-POT CLAVULANATE 875-125 MG PO TABS: 1.0000 | ORAL_TABLET | Freq: Two times a day (BID) | ORAL | 0 refills | Status: DC

## 2017-10-31 MED ORDER — HYDROCODONE-ACETAMINOPHEN 5-325 MG PO TABS: 2.0000 | ORAL_TABLET | Freq: Four times a day (QID) | ORAL | 0 refills | Status: DC | PRN

## 2017-10-31 MED ORDER — NAPROXEN 500 MG PO TABS: 500.0000 mg | ORAL_TABLET | Freq: Two times a day (BID) | ORAL | 0 refills | Status: DC | PRN

## 2017-10-31 MED ORDER — KETOROLAC TROMETHAMINE 60 MG/2ML IM SOLN
60.0000 mg | Freq: Once | INTRAMUSCULAR | Status: AC
  Administered 2017-10-31: 60 mg via INTRAMUSCULAR

## 2017-10-31 NOTE — ED Triage Notes (Signed)
Pt here for right sided dental pain 

## 2017-10-31 NOTE — ED Provider Notes (Signed)
[facility]   [id number] 10/31/17 Arrival Time: 1020  SUBJECTIVE:  Joanne Huff is a 27 y.o. female who reports abrupt onset of right upper dental pain described as achy/throbbing. Present for 2 days. Afebrile. Tolerating PO intake but reports pain with chewing. Normal swallowing. She does not see a dentist regularly. No neck swelling or pain. OTC analgesics without relief.  ROS: As per HPI.  Past Medical History:  Diagnosis Date  . Chlamydia   . High risk HPV infection   . HSV-1 infection   . Marijuana use   . Ovarian cyst   . Smoker   . Trichimoniasis    Past Surgical History:  Procedure Laterality Date  . NO PAST SURGERIES     No Known Allergies No current facility-administered medications on file prior to encounter.    Current Outpatient Medications on File Prior to Encounter  Medication Sig Dispense Refill  . ondansetron (ZOFRAN-ODT) 8 MG disintegrating tablet Take 1 tablet (8 mg total) by mouth every 8 (eight) hours as needed for nausea. 12 tablet 0   Social History   Socioeconomic History  . Marital status: Single    Spouse name: Not on file  . Number of children: Not on file  . Years of education: Not on file  . Highest education level: Not on file  Occupational History  . Not on file  Social Needs  . Financial resource strain: Not on file  . Food insecurity:    Worry: Not on file    Inability: Not on file  . Transportation needs:    Medical: Not on file    Non-medical: Not on file  Tobacco Use  . Smoking status: Former Smoker    Packs/day: 0.50    Years: 2.00    Pack years: 1.00    Types: Cigarettes    Last attempt to quit: 03/02/2014    Years since quitting: 3.6  . Smokeless tobacco: Never Used  Substance and Sexual Activity  . Alcohol use: No  . Drug use: Yes    Types: Marijuana  . Sexual activity: Yes    Birth control/protection: None  Lifestyle  . Physical activity:    Days per week: Not on file    Minutes per session: Not  on file  . Stress: Not on file  Relationships  . Social connections:    Talks on phone: Not on file    Gets together: Not on file    Attends religious service: Not on file    Active member of club or organization: Not on file    Attends meetings of clubs or organizations: Not on file    Relationship status: Not on file  . Intimate partner violence:    Fear of current or ex partner: Not on file    Emotionally abused: Not on file    Physically abused: Not on file    Forced sexual activity: Not on file  Other Topics Concern  . Not on file  Social History Narrative  . Not on file   Family History  Problem Relation Age of Onset  . Hypertension Mother   . Stroke Mother   . Arthritis Father   . Hypertension Maternal Grandmother   . Diabetes Paternal Grandmother     OBJECTIVE:  Vitals:   10/31/17 1036  BP: 130/79  Pulse: 84  Resp: 20  Temp: 98.3 F (36.8 C)  TempSrc: Oral  SpO2: 100%    General appearance: alert; nontoxic appearance; appears uncomfortable, tearful during  encounter HENT: normocephalic; atraumatic; dentition: fair without obvious dental caries; inflamed over right upper gums without areas of fluctuance; tender to palpation over right top back molars and right upper gingiva and buccal mucosa Neck: supple without LAD Lungs: normal respirations Skin: warm and dry Psychological: alert and cooperative; normal mood and affect  ASSESSMENT & PLAN:  1. Dental caries   2. Pain, dental   3. Dental infection     Meds ordered this encounter  Medications  . ketorolac (TORADOL) injection 60 mg  . amoxicillin-clavulanate (AUGMENTIN) 875-125 MG tablet    Sig: Take 1 tablet by mouth every 12 (twelve) hours.    Dispense:  14 tablet    Refill:  0    Order Specific Question:   Supervising Provider    Answer:   Isa RankinMURRAY, LAURA WILSON [phone][[id number]]  . naproxen (NAPROSYN) 500 MG tablet    Sig: Take 1 tablet (500 mg total) by mouth 2 (two) times daily as needed for mild pain  or moderate pain.    Dispense:  30 tablet    Refill:  0    Order Specific Question:   Supervising Provider    Answer:   Isa RankinMURRAY, LAURA WILSON [phone][[id number]  . HYDROcodone-acetaminophen (NORCO/VICODIN) 5-325 MG tablet    Sig: Take 2 tablets by mouth every 6 (six) hours as needed for severe pain.    Dispense:  6 tablet    Refill:  0    Order Specific Question:   Supervising Provider    Answer:   Isa RankinMURRAY, LAURA WILSON [[id number]]    Toradol shot given in office Use naproxen as needed for pain Norco as need for break-through pain Recommend soft diet until evaluated by dentist Maintain oral hygiene care Follow up with dentist as soon as possible for further evaluation and treatment   Reviewed expectations re: course of current medical issues. Questions answered. Outlined signs and symptoms indicating need for more acute intervention. Patient verbalized understanding. After Visit Summary given.   Rennis HardingWurst, Roslin Norwood, PA-C 10/31/17 1144

## 2017-10-31 NOTE — Discharge Instructions (Addendum)
Toradol shot given in office Augmentin prescribed for potential dental infection.  Take as directed and to completion Use naproxen as needed for pain Norco as need for break-through pain Recommend soft diet until evaluated by dentist Maintain oral hygiene care Follow up with dentist as soon as possible for further evaluation and treatment

## 2017-12-31 ENCOUNTER — Inpatient Hospital Stay (HOSPITAL_COMMUNITY)
Admission: AD | Admit: 2017-12-31 | Discharge: 2017-12-31 | Disposition: A | Discharge status: Home / Self Care | Payer: Medicaid Other | Source: Ambulatory Visit | Attending: Obstetrics & Gynecology | Admitting: Obstetrics & Gynecology

## 2017-12-31 ENCOUNTER — Other Ambulatory Visit: Payer: Self-pay

## 2017-12-31 ENCOUNTER — Inpatient Hospital Stay (HOSPITAL_COMMUNITY): Payer: Medicaid Other

## 2017-12-31 DIAGNOSIS — N939 Abnormal uterine and vaginal bleeding, unspecified: Secondary | ICD-10-CM | POA: Diagnosis present

## 2017-12-31 DIAGNOSIS — O26891 Other specified pregnancy related conditions, first trimester: Secondary | ICD-10-CM

## 2017-12-31 DIAGNOSIS — Z8261 Family history of arthritis: Secondary | ICD-10-CM | POA: Insufficient documentation

## 2017-12-31 DIAGNOSIS — Z823 Family history of stroke: Secondary | ICD-10-CM | POA: Insufficient documentation

## 2017-12-31 DIAGNOSIS — O23591 Infection of other part of genital tract in pregnancy, first trimester: Secondary | ICD-10-CM | POA: Insufficient documentation

## 2017-12-31 DIAGNOSIS — R109 Unspecified abdominal pain: Secondary | ICD-10-CM | POA: Insufficient documentation

## 2017-12-31 DIAGNOSIS — F17210 Nicotine dependence, cigarettes, uncomplicated: Secondary | ICD-10-CM | POA: Insufficient documentation

## 2017-12-31 DIAGNOSIS — O283 Abnormal ultrasonic finding on antenatal screening of mother: Secondary | ICD-10-CM

## 2017-12-31 DIAGNOSIS — Z8249 Family history of ischemic heart disease and other diseases of the circulatory system: Secondary | ICD-10-CM | POA: Insufficient documentation

## 2017-12-31 DIAGNOSIS — B9689 Other specified bacterial agents as the cause of diseases classified elsewhere: Secondary | ICD-10-CM | POA: Diagnosis not present

## 2017-12-31 DIAGNOSIS — N760 Acute vaginitis: Secondary | ICD-10-CM

## 2017-12-31 DIAGNOSIS — O99331 Smoking (tobacco) complicating pregnancy, first trimester: Secondary | ICD-10-CM | POA: Insufficient documentation

## 2017-12-31 DIAGNOSIS — O3680X0 Pregnancy with inconclusive fetal viability, not applicable or unspecified: Secondary | ICD-10-CM | POA: Insufficient documentation

## 2017-12-31 DIAGNOSIS — Z3A01 Less than 8 weeks gestation of pregnancy: Secondary | ICD-10-CM | POA: Diagnosis not present

## 2017-12-31 DIAGNOSIS — O26899 Other specified pregnancy related conditions, unspecified trimester: Secondary | ICD-10-CM

## 2017-12-31 HISTORY — DX: Gestational (pregnancy-induced) hypertension without significant proteinuria, unspecified trimester: O13.9

## 2017-12-31 HISTORY — DX: Unspecified infectious disease: B99.9

## 2017-12-31 LAB — CBC
HCT: 35.2 % — ABNORMAL LOW (ref 36.0–46.0)
Hemoglobin: 11.5 g/dL — ABNORMAL LOW (ref 12.0–15.0)
MCH: 29.3 pg (ref 26.0–34.0)
MCHC: 32.7 g/dL (ref 30.0–36.0)
MCV: 89.8 fL (ref 78.0–100.0)
Platelets: 250 10*3/uL (ref 150–400)
RBC: 3.92 MIL/uL (ref 3.87–5.11)
RDW: 14.7 % (ref 11.5–15.5)
WBC: 6.4 10*3/uL (ref 4.0–10.5)

## 2017-12-31 LAB — WET PREP, GENITAL
Sperm: NONE SEEN
Trich, Wet Prep: NONE SEEN
Yeast Wet Prep HPF POC: NONE SEEN

## 2017-12-31 LAB — URINALYSIS, ROUTINE W REFLEX MICROSCOPIC
Bilirubin Urine: NEGATIVE
Glucose, UA: NEGATIVE mg/dL
Hgb urine dipstick: NEGATIVE
Ketones, ur: NEGATIVE mg/dL
Nitrite: NEGATIVE
Protein, ur: NEGATIVE mg/dL
Specific Gravity, Urine: 1.017 (ref 1.005–1.030)
pH: 8 (ref 5.0–8.0)

## 2017-12-31 LAB — HCG, QUANTITATIVE, PREGNANCY: hCG, Beta Chain, Quant, S: 4707 m[IU]/mL — ABNORMAL HIGH (ref <5)

## 2017-12-31 LAB — POCT PREGNANCY, URINE: Preg Test, Ur: POSITIVE — AB

## 2017-12-31 MED ORDER — METRONIDAZOLE 500 MG PO TABS: 500.0000 mg | ORAL_TABLET | Freq: Two times a day (BID) | ORAL | 0 refills | Status: DC

## 2017-12-31 NOTE — Discharge Instructions (Signed)

## 2017-12-31 NOTE — MAU Note (Signed)
Left side abd pain. Positive preg. Test at home sept 14. Spotting. No birthcontrol. One child 6383yr old son. Here to conform pregnancy. Have had tooth ache 3 months trying to self treat. Need referral  to dentist due to preganacy.

## 2017-12-31 NOTE — MAU Note (Signed)
Cramping started 2 days ago. +HPT 2 days. Spotted 2 days ago and the first time was 4 days ago. None now, wanting to make sure everything was ok.

## 2017-12-31 NOTE — MAU Provider Note (Signed)
History     CSN: [id number]  Arrival date and time: 12/31/17 1012   First Provider Initiated Contact with Patient 12/31/17 1144      Chief Complaint  Patient presents with  . Vaginal Bleeding  . Abdominal Pain  . Possible Pregnancy   HPI Joanne Huff is a 27 y.o. G2P1001 at [redacted]w[redacted]d who presents with lower abdominal cramping. She rates the pain a 4/10 and has not tried anything for the pain. She also reports some spotting but that has resolved. LMP early August with a positive HPT last week.   OB History    Gravida  2   Para  1   Term  1   Preterm      AB      Living  1     SAB      TAB      Ectopic      Multiple  0   Live Births  1           Past Medical History:  Diagnosis Date  . Chlamydia   . High risk HPV infection   . HSV-1 infection   . Infection    UTI  . Marijuana use   . Ovarian cyst   . Pregnancy induced hypertension   . Smoker   . Trichimoniasis     Past Surgical History:  Procedure Laterality Date  . NO PAST SURGERIES      Family History  Problem Relation Age of Onset  . Hypertension Mother   . Stroke Mother   . Arthritis Father   . Hypertension Father   . Hypertension Maternal Grandmother   . Diabetes Maternal Grandmother   . Diabetes Paternal Grandmother     Social History   Tobacco Use  . Smoking status: Current Every Day Smoker    Packs/day: 0.50    Years: 13.00    Pack years: 6.50    Types: Cigarettes  . Smokeless tobacco: Never Used  Substance Use Topics  . Alcohol use: No  . Drug use: Not Currently    Types: Marijuana    Comment: Aug 2019    Allergies: No Known Allergies  Medications Prior to Admission  Medication Sig Dispense Refill Last Dose  . amoxicillin-clavulanate (AUGMENTIN) 875-125 MG tablet Take 1 tablet by mouth every 12 (twelve) hours. 14 tablet 0   . HYDROcodone-acetaminophen (NORCO/VICODIN) 5-325 MG tablet Take 2 tablets by mouth every 6 (six) hours as needed for severe pain. 6 tablet 0    . naproxen (NAPROSYN) 500 MG tablet Take 1 tablet (500 mg total) by mouth 2 (two) times daily as needed for mild pain or moderate pain. 30 tablet 0   . ondansetron (ZOFRAN-ODT) 8 MG disintegrating tablet Take 1 tablet (8 mg total) by mouth every 8 (eight) hours as needed for nausea. 12 tablet 0     Review of Systems  Constitutional: Negative.  Negative for fatigue and fever.  HENT: Negative.   Respiratory: Negative.  Negative for shortness of breath.   Cardiovascular: Negative.  Negative for chest pain.  Gastrointestinal: Positive for abdominal pain. Negative for constipation, diarrhea, nausea and vomiting.  Genitourinary: Negative.  Negative for dysuria, vaginal bleeding and vaginal discharge.  Neurological: Negative.  Negative for dizziness and headaches.   Physical Exam   Blood pressure 126/76, pulse 62, temperature 98.3 F (36.8 C), temperature source Oral, height 5' 8.5" (1.74 m), weight 105.9 kg, last menstrual period 11/19/2017, SpO2 100 %.  Physical Exam  Nursing note  and vitals reviewed. Constitutional: She is oriented to person, place, and time. She appears well-developed and well-nourished. No distress.  HENT:  Head: Normocephalic.  Eyes: Pupils are equal, round, and reactive to light.  Cardiovascular: Normal rate, regular rhythm and normal heart sounds.  Respiratory: Effort normal and breath sounds normal. No respiratory distress.  GI: Soft. Bowel sounds are normal. She exhibits no distension. There is no tenderness.  Neurological: She is alert and oriented to person, place, and time.  Skin: Skin is warm and dry.  Psychiatric: She has a normal mood and affect. Her behavior is normal. Judgment and thought content normal.    MAU Course  Procedures Results for orders placed or performed during the hospital encounter of 12/31/17 (from the past 24 hour(s))  Urinalysis, Routine w reflex microscopic     Status: Abnormal   Collection Time: 12/31/17 10:55 AM  Result Value  Ref Range   Color, Urine YELLOW YELLOW   APPearance HAZY (A) CLEAR   Specific Gravity, Urine 1.017 1.005 - 1.030   pH 8.0 5.0 - 8.0   Glucose, UA NEGATIVE NEGATIVE mg/dL   Hgb urine dipstick NEGATIVE NEGATIVE   Bilirubin Urine NEGATIVE NEGATIVE   Ketones, ur NEGATIVE NEGATIVE mg/dL   Protein, ur NEGATIVE NEGATIVE mg/dL   Nitrite NEGATIVE NEGATIVE   Leukocytes, UA SMALL (A) NEGATIVE   RBC / HPF 0-5 0 - 5 RBC/hpf   WBC, UA 6-10 0 - 5 WBC/hpf   Bacteria, UA RARE (A) NONE SEEN   Squamous Epithelial / LPF 11-20 0 - 5   Sperm, UA PRESENT   Pregnancy, urine POC     Status: Abnormal   Collection Time: 12/31/17 10:59 AM  Result Value Ref Range   Preg Test, Ur POSITIVE (A) NEGATIVE  CBC     Status: Abnormal   Collection Time: 12/31/17 11:44 AM  Result Value Ref Range   WBC 6.4 4.0 - 10.5 K/uL   RBC 3.92 3.87 - 5.11 MIL/uL   Hemoglobin 11.5 (L) 12.0 - 15.0 g/dL   HCT 29.535.2 (L) 62.136.0 - 30.846.0 %   MCV 89.8 78.0 - 100.0 fL   MCH 29.3 26.0 - 34.0 pg   MCHC 32.7 30.0 - 36.0 g/dL   RDW 65.714.7 84.611.5 - 96.215.5 %   Platelets 250 150 - 400 K/uL  hCG, quantitative, pregnancy     Status: Abnormal   Collection Time: 12/31/17 11:44 AM  Result Value Ref Range   hCG, Beta Chain, Quant, S 4,707 (H) <5 mIU/mL   [place] Ob Less Than 14 Weeks With Ob Transvaginal  Result Date: 12/31/2017 CLINICAL DATA:  27 year old pregnant female with left-sided pain and cramping. LMP is uncertain. Estimated EDC by LMP is 08/26/2018, projecting to an expected gestational age of [redacted] weeks 0 days EXAM: OBSTETRIC <14 WK US AND TRANSVAGINAL OB US TECHNIQUE: Both transabdominal and transvaginal ultrasound examinations were performed for complete evaluation of the gestation as well as the maternal uterus, adnexal regions, and pelvic cul-de-sac. Transvaginal technique was performed to assess early pregnancy. COMPARISON:  No prior scans from this gestation. FINDINGS: Intrauterine gestational sac: Single intrauterine gestational sac appears normal  in shape and position. Yolk sac:  Visualized. Embryo:  Not Visualized. MSD: 7.5 mm   5 w   3 d Subchorionic hemorrhage:  None visualized. Maternal uterus/adnexae: Anteverted uterus, with no uterine fibroids demonstrated. Small volume simple appearing free fluid in the pelvic cul-de-sac. Right ovary measures 3.0 x 2.8 x 2.6 cm and contains a corpus luteum. Left  ovary measures 3.5 x 1.9 x 1.9 cm. No abnormal ovarian or adnexal masses. IMPRESSION: 1. Single intrauterine gestational sac with yolk sac measuring 5 weeks 3 days by mean sac diameter. No embryo visualized at this time, which could be due to early gestational age. Recommend follow-up US in 11-14 days for definitive assessment of gestational viability. This recommendation follows SRU consensus guidelines: Diagnostic Criteria for Nonviable Pregnancy Early in the First Trimester. Malva Limes Med 2013; 161:0960-45. 2. No suspicious ovarian or adnexal findings. Small volume simple free fluid in the pelvis. Electronically Signed   By: Delbert Phenix M.D.   On: 12/31/2017 14:49   MDM UA, UPT CBC, HCG, ABO/Rh Wet prep and gc/chlamydia US OB Comp Less 14 weeks with Transvaginal   Assessment and Plan   1. Pregnancy of unknown anatomic location   2. Abdominal pain affecting pregnancy   3. Abdominal pain during pregnancy in first trimester   4. BV (bacterial vaginosis)    -Discharge home in stable condition -Rx for metronidazole given to patient -Strict ectopic precautions discussed -Patient advised to follow-up with [facility] on 9/18 for repeat blood work -Patient may return to MAU as needed or if her condition were to change or worsen   Rolm Bookbinder CNM 12/31/2017, 1:13 PM

## 2018-01-01 LAB — GC/CHLAMYDIA PROBE AMP (~~LOC~~) NOT AT ARMC
Chlamydia: NEGATIVE
Neisseria Gonorrhea: NEGATIVE

## 2018-01-02 ENCOUNTER — Encounter: Payer: Self-pay | Admitting: *Deleted

## 2018-01-02 ENCOUNTER — Telehealth: Payer: Self-pay | Admitting: *Deleted

## 2018-01-02 ENCOUNTER — Ambulatory Visit: Payer: Medicaid Other

## 2018-01-02 NOTE — Telephone Encounter (Signed)
[facility] I called [facility] twice and both times got a busy signal and was unable to leave a message. Will send letter to notify her she missed her appointment and needs to be seen.

## 2018-04-12 ENCOUNTER — Ambulatory Visit (HOSPITAL_COMMUNITY)
Admission: EM | Admit: 2018-04-12 | Discharge: 2018-04-12 | Disposition: A | Discharge status: Home / Self Care | Payer: Medicaid Other | Attending: Family Medicine | Admitting: Family Medicine

## 2018-04-12 ENCOUNTER — Encounter (HOSPITAL_COMMUNITY): Payer: Self-pay

## 2018-04-12 DIAGNOSIS — K0889 Other specified disorders of teeth and supporting structures: Secondary | ICD-10-CM | POA: Diagnosis not present

## 2018-04-12 MED ORDER — ACETAMINOPHEN-CODEINE #3 300-30 MG PO TABS: 1.0000 | ORAL_TABLET | Freq: Four times a day (QID) | ORAL | 0 refills | Status: DC | PRN

## 2018-04-12 NOTE — ED Provider Notes (Signed)
[facility]    CSN: [id number] Arrival date & time: 04/12/18  1448     History   Chief Complaint Chief Complaint  Patient presents with  . Dental Pain    HPI Joanne Huff is a 27 y.o. female.   HPI  Patient fractured 1 of her molars today.  It is severely painful.  She took some Tylenol.  She is here for help.  She is holding onto her jaw.  She is acutely uncomfortable.  She is afraid to take anything else for pain.  She is [redacted] weeks pregnant.  No fever or signs of infection.  Past Medical History:  Diagnosis Date  . Chlamydia   . High risk HPV infection   . HSV-1 infection   . Infection    UTI  . Marijuana use   . Ovarian cyst   . Pregnancy induced hypertension   . Smoker   . Trichimoniasis     Patient Active Problem List   Diagnosis Date Noted  . Preeclampsia 09/01/2014    Past Surgical History:  Procedure Laterality Date  . NO PAST SURGERIES      OB History    Gravida  2   Para  1   Term  1   Preterm      AB      Living  1     SAB      TAB      Ectopic      Multiple  0   Live Births  1            Home Medications    Prior to Admission medications   Medication Sig Start Date End Date Taking? Authorizing Provider  acetaminophen-codeine (TYLENOL #3) 300-30 MG tablet Take 1 tablet by mouth every 6 (six) hours as needed for moderate pain. 04/12/18   Eustace MooreNelson, Shadasia Oldfield Sue, MD    Family History Family History  Problem Relation Age of Onset  . Hypertension Mother   . Stroke Mother   . Arthritis Father   . Hypertension Father   . Hypertension Maternal Grandmother   . Diabetes Maternal Grandmother   . Diabetes Paternal Grandmother     Social History Social History   Tobacco Use  . Smoking status: Current Every Day Smoker    Packs/day: 0.50    Years: 13.00    Pack years: 6.50    Types: Cigarettes  . Smokeless tobacco: Never Used  Substance Use Topics  . Alcohol use: No  . Drug use: Not Currently    Types:  Marijuana    Comment: Aug 2019     Allergies   Patient has no known allergies.   Review of Systems Review of Systems  Constitutional: Negative for chills and fever.  HENT: Positive for dental problem. Negative for ear pain and sore throat.   Eyes: Negative for pain and visual disturbance.  Respiratory: Negative for cough and shortness of breath.   Cardiovascular: Negative for chest pain and palpitations.  Gastrointestinal: Negative for abdominal pain and vomiting.  Genitourinary: Negative for dysuria and hematuria.  Musculoskeletal: Negative for arthralgias and back pain.  Skin: Negative for color change and rash.  Neurological: Negative for seizures and syncope.  All other systems reviewed and are negative.    Physical Exam Triage Vital Signs ED Triage Vitals  Enc Vitals Group     BP 04/12/18 1539 133/72     Pulse Rate 04/12/18 1539 79     Resp 04/12/18 1539 20  Temp 04/12/18 1539 98.4 F (36.9 C)     Temp Source 04/12/18 1539 Oral     SpO2 04/12/18 1539 97 %     Weight --      Height --      Head Circumference --      Peak Flow --      Pain Score 04/12/18 1540 9     Pain Loc --      Pain Edu? --      Excl. in GC? --    No data found.  Updated Vital Signs BP 133/72 (BP Location: Right Arm)   Pulse 79   Temp 98.4 F (36.9 C) (Oral)   Resp 20   LMP 11/19/2017   SpO2 97%   Visual Acuity Right Eye Distance:   Left Eye Distance:   Bilateral Distance:    Right Eye Near:   Left Eye Near:    Bilateral Near:     Physical Exam Constitutional:      General: She is not in acute distress.    Appearance: She is well-developed.  HENT:     Head: Normocephalic and atraumatic.     Mouth/Throat:   Eyes:     Conjunctiva/sclera: Conjunctivae normal.     Pupils: Pupils are equal, round, and reactive to light.  Neck:     Musculoskeletal: Normal range of motion.  Cardiovascular:     Rate and Rhythm: Normal rate.  Pulmonary:     Effort: Pulmonary effort is  normal. No respiratory distress.  Abdominal:     General: There is no distension.     Palpations: Abdomen is soft.  Musculoskeletal: Normal range of motion.  Skin:    General: Skin is warm and dry.  Neurological:     Mental Status: She is alert.      UC Treatments / Results  Labs (all labs ordered are listed, but only abnormal results are displayed) Labs Reviewed - No data to display  EKG None  Radiology No results found.  Procedures Procedures (including critical care time)  Medications Ordered in UC Medications - No data to display  Initial Impression / Assessment and Plan / UC Course  I have reviewed the triage vital signs and the nursing notes.  Pertinent labs & imaging results that were available during my care of the patient were reviewed by me and considered in my medical decision making (see chart for details).     Reviewed with her that the safest medication to take for her tooth fracture is Tylenol.  If the Tylenol is not effective she can have a few Tylenol with codeine.  Caution drowsiness.  Caution constipation.  She needs to get a dentist ASAP. Final Clinical Impressions(s) / UC Diagnoses   Final diagnoses:  Pain, dental     Discharge Instructions     Tylenol is safe to take during pregnancy See list I gave you Go to pharmacy and look for tooth patch Take the stronger pain medicine only if really needed Call dentist on Monday   ED Prescriptions    Medication Sig Dispense Auth. Provider   acetaminophen-codeine (TYLENOL #3) 300-30 MG tablet Take 1 tablet by mouth every 6 (six) hours as needed for moderate pain. 10 tablet Eustace Moore, MD     Controlled Substance Prescriptions [place] Controlled Substance Registry consulted? Yes, I have consulted the [place] Controlled Substances Registry for this patient, and feel the risk/benefit ratio today is favorable for proceeding with this prescription for a controlled substance.  Eustace MooreNelson, Tiaria Biby Sue,  MD 04/12/18 [phone]

## 2018-04-12 NOTE — Discharge Instructions (Signed)
Tylenol is safe to take during pregnancy See list I gave you Go to pharmacy and look for tooth patch Take the stronger pain medicine only if really needed Call dentist on Monday

## 2018-04-12 NOTE — ED Triage Notes (Signed)
Pt presents with dental pain.

## 2018-04-12 NOTE — ED Triage Notes (Signed)
Pt is  [redacted] weeks pregnant 

## 2018-04-13 ENCOUNTER — Emergency Department (HOSPITAL_COMMUNITY)
Admission: EM | Admit: 2018-04-13 | Discharge: 2018-04-13 | Disposition: A | Discharge status: Home / Self Care | Payer: Medicaid Other | Attending: Emergency Medicine | Admitting: Emergency Medicine

## 2018-04-13 ENCOUNTER — Encounter (HOSPITAL_COMMUNITY): Payer: Self-pay | Admitting: Emergency Medicine

## 2018-04-13 DIAGNOSIS — Y929 Unspecified place or not applicable: Secondary | ICD-10-CM | POA: Diagnosis not present

## 2018-04-13 DIAGNOSIS — O9989 Other specified diseases and conditions complicating pregnancy, childbirth and the puerperium: Secondary | ICD-10-CM | POA: Insufficient documentation

## 2018-04-13 DIAGNOSIS — Y998 Other external cause status: Secondary | ICD-10-CM | POA: Insufficient documentation

## 2018-04-13 DIAGNOSIS — K0889 Other specified disorders of teeth and supporting structures: Secondary | ICD-10-CM | POA: Diagnosis not present

## 2018-04-13 DIAGNOSIS — O99332 Smoking (tobacco) complicating pregnancy, second trimester: Secondary | ICD-10-CM | POA: Insufficient documentation

## 2018-04-13 DIAGNOSIS — Y33XXXA Other specified events, undetermined intent, initial encounter: Secondary | ICD-10-CM | POA: Insufficient documentation

## 2018-04-13 DIAGNOSIS — Y939 Activity, unspecified: Secondary | ICD-10-CM | POA: Diagnosis not present

## 2018-04-13 DIAGNOSIS — F17210 Nicotine dependence, cigarettes, uncomplicated: Secondary | ICD-10-CM | POA: Insufficient documentation

## 2018-04-13 DIAGNOSIS — S025XXA Fracture of tooth (traumatic), initial encounter for closed fracture: Secondary | ICD-10-CM

## 2018-04-13 NOTE — ED Triage Notes (Signed)
Pt reports dental pain X2 days, pt states she chipped her tooth. Pt is approx 5 mo pregnant, has been taking Tylenol for pain.

## 2018-04-13 NOTE — Discharge Instructions (Addendum)
Take no more than 4000 mg Tylenol in a 24 hours period - that's 2 of the Extra Strength tablets every 6 hours. Use topical anesthetic for relief of pain. Make an appointment for dental treatment as soon as possible.

## 2018-04-13 NOTE — ED Provider Notes (Signed)
[facility]   CSN: [id number] Arrival date & time: 04/13/18  1957     History   Chief Complaint Chief Complaint  Patient presents with  . Dental Pain    HPI Joanne Huff is a 27 y.o. female.  Patient here with severe dental pain due to fractured tooth that broke yesterday. She reports pain at the rear lower left molar. Seen earlier today at Urgent Care and given Tylenol #3 but can't afford the medication. She is 5 months pregnant. She has been taking Tylenol but has taken 4500 mg today without relief, prompting return for medical treatment. No fever or facial swelling.   The history is provided by the patient. No language interpreter was used.  Dental Pain      Past Medical History:  Diagnosis Date  . Chlamydia   . High risk HPV infection   . HSV-1 infection   . Infection    UTI  . Marijuana use   . Ovarian cyst   . Pregnancy induced hypertension   . Smoker   . Trichimoniasis     Patient Active Problem List   Diagnosis Date Noted  . Preeclampsia 09/01/2014    Past Surgical History:  Procedure Laterality Date  . NO PAST SURGERIES       OB History    Gravida  2   Para  1   Term  1   Preterm      AB      Living  1     SAB      TAB      Ectopic      Multiple  0   Live Births  1            Home Medications    Prior to Admission medications   Medication Sig Start Date End Date Taking? Authorizing Provider  acetaminophen-codeine (TYLENOL #3) 300-30 MG tablet Take 1 tablet by mouth every 6 (six) hours as needed for moderate pain. 04/12/18   Eustace MooreNelson, Yvonne Sue, MD    Family History Family History  Problem Relation Age of Onset  . Hypertension Mother   . Stroke Mother   . Arthritis Father   . Hypertension Father   . Hypertension Maternal Grandmother   . Diabetes Maternal Grandmother   . Diabetes Paternal Grandmother     Social History Social History   Tobacco Use  .  Smoking status: Current Every Day Smoker    Packs/day: 0.50    Years: 13.00    Pack years: 6.50    Types: Cigarettes  . Smokeless tobacco: Never Used  Substance Use Topics  . Alcohol use: No  . Drug use: Not Currently    Types: Marijuana    Comment: Aug 2019     Allergies   Patient has no known allergies.   Review of Systems Review of Systems  Constitutional: Negative for fever.  HENT: Positive for dental problem. Negative for facial swelling and trouble swallowing.   Gastrointestinal: Negative for abdominal pain and nausea.     Physical Exam Updated Vital Signs BP (!) 142/84   Pulse 90   Temp 97.8 F (36.6 C) (Oral)   Resp (!) 22   Ht 5\' 10"  (1.778 m)   Wt 104.3 kg   LMP 11/19/2017   SpO2 97%   BMI 33.00 kg/m   Physical Exam Constitutional:      Appearance: She is well-developed.  HENT:     Head:  Comments: Generally good dentition. Fracture to rear lower left molar. No surrounding swelling.  Neck:     Musculoskeletal: Normal range of motion.  Pulmonary:     Effort: Pulmonary effort is normal.  Skin:    General: Skin is warm and dry.  Neurological:     Mental Status: She is alert and oriented to person, place, and time.      ED Treatments / Results  Labs (all labs ordered are listed, but only abnormal results are displayed) Labs Reviewed - No data to display  EKG None  Radiology No results found.  Procedures Procedures (including critical care time)  Medications Ordered in ED Medications - No data to display   Initial Impression / Assessment and Plan / ED Course  I have reviewed the triage vital signs and the nursing notes.  Pertinent labs & imaging results that were available during my care of the patient were reviewed by me and considered in my medical decision making (see chart for details).     Patient here with severe dental pain after breaking a tooth yesterday. Tylenol without relief.   Benzocaine topical applied with  relief of pain. Local injection offered but patient declines.   Discussed safe doses of Tylenol and not to exceed more than 4000 mg in a 24 hour period. Patient acknowledges understanding.   Will provide dental referrals. She is given benzocaine application for home use.   Final Clinical Impressions(s) / ED Diagnoses   Final diagnoses:  None   1. Dental pain 2. Dental fracture  ED Discharge Orders    None       Danne HarborUpstill, Amena Dockham, PA-C 04/13/18 2333    Terrilee FilesButler, Michael C, MD 04/14/18 1323

## 2018-07-30 ENCOUNTER — Encounter (HOSPITAL_COMMUNITY): Payer: Self-pay | Admitting: *Deleted

## 2018-07-30 ENCOUNTER — Other Ambulatory Visit: Payer: Self-pay

## 2018-07-30 ENCOUNTER — Inpatient Hospital Stay (HOSPITAL_COMMUNITY)
Admission: AD | Admit: 2018-07-30 | Discharge: 2018-07-30 | Disposition: A | Discharge status: Home / Self Care | Payer: Medicaid Other | Source: Ambulatory Visit | Attending: Family Medicine | Admitting: Family Medicine

## 2018-07-30 DIAGNOSIS — R42 Dizziness and giddiness: Secondary | ICD-10-CM | POA: Insufficient documentation

## 2018-07-30 DIAGNOSIS — O4703 False labor before 37 completed weeks of gestation, third trimester: Secondary | ICD-10-CM | POA: Diagnosis not present

## 2018-07-30 DIAGNOSIS — Y92009 Unspecified place in unspecified non-institutional (private) residence as the place of occurrence of the external cause: Secondary | ICD-10-CM

## 2018-07-30 DIAGNOSIS — O26893 Other specified pregnancy related conditions, third trimester: Secondary | ICD-10-CM | POA: Insufficient documentation

## 2018-07-30 DIAGNOSIS — Z3A36 36 weeks gestation of pregnancy: Secondary | ICD-10-CM | POA: Diagnosis not present

## 2018-07-30 DIAGNOSIS — O9A213 Injury, poisoning and certain other consequences of external causes complicating pregnancy, third trimester: Secondary | ICD-10-CM | POA: Diagnosis not present

## 2018-07-30 DIAGNOSIS — Z7982 Long term (current) use of aspirin: Secondary | ICD-10-CM | POA: Diagnosis not present

## 2018-07-30 DIAGNOSIS — W1839XA Other fall on same level, initial encounter: Secondary | ICD-10-CM | POA: Diagnosis not present

## 2018-07-30 DIAGNOSIS — R51 Headache: Secondary | ICD-10-CM | POA: Diagnosis not present

## 2018-07-30 DIAGNOSIS — Z87891 Personal history of nicotine dependence: Secondary | ICD-10-CM | POA: Diagnosis not present

## 2018-07-30 DIAGNOSIS — W19XXXA Unspecified fall, initial encounter: Secondary | ICD-10-CM

## 2018-07-30 DIAGNOSIS — M79605 Pain in left leg: Secondary | ICD-10-CM

## 2018-07-30 DIAGNOSIS — R519 Headache, unspecified: Secondary | ICD-10-CM

## 2018-07-30 HISTORY — DX: Headache, unspecified: R51.9

## 2018-07-30 HISTORY — DX: Headache: R51

## 2018-07-30 LAB — URINALYSIS, ROUTINE W REFLEX MICROSCOPIC
Bilirubin Urine: NEGATIVE
Glucose, UA: NEGATIVE mg/dL
Hgb urine dipstick: NEGATIVE
Ketones, ur: NEGATIVE mg/dL
Nitrite: NEGATIVE
Protein, ur: NEGATIVE mg/dL
Specific Gravity, Urine: 1.019 (ref 1.005–1.030)
pH: 6 (ref 5.0–8.0)

## 2018-07-30 MED ORDER — ACETAMINOPHEN 500 MG PO TABS
1000.0000 mg | ORAL_TABLET | Freq: Once | ORAL | Status: AC
  Administered 2018-07-30: 11:00:00 1000 mg via ORAL
  Filled 2018-07-30: qty 2

## 2018-07-30 MED ORDER — ASPIRIN 81 MG PO CHEW
81.0000 mg | CHEWABLE_TABLET | Freq: Once | ORAL | Status: AC
  Administered 2018-07-30: 81 mg via ORAL
  Filled 2018-07-30: qty 1

## 2018-07-30 NOTE — MAU Note (Signed)
Pt thinks she may have hit her head when she fell because she has soreness behind her left ear.

## 2018-07-30 NOTE — MAU Note (Signed)
Tripped and fell last night, was walking in the dark.  Doesn't know how she landed, she just hopped back up and went to bed.  Woke up with contractions this morning.  Hips are sore, some tension in the left side of her neck, back of head.   Is seeing spots, a little dizzy, headache.

## 2018-07-30 NOTE — Discharge Instructions (Signed)
First Stage of Labor °Labor is your body's natural process of moving your baby and other structures, including the placenta and umbilical cord, out of your uterus. There are three stages of labor. How long each stage lasts is different for every woman. But certain events happen during each stage that are the same for everyone. °· The first stage starts when true labor begins. This stage ends when your cervix, which is the opening from your uterus into your vagina, is completely open (dilated). °· The second stage begins when your cervix is fully dilated and you start pushing. This stage ends when your baby is born. °· The third stage is the delivery of the organ that nourished your baby during pregnancy (placenta). °First stage of labor °As your due date gets closer, you may start to notice certain physical changes that mean labor is going to start soon. You may feel that your baby has dropped lower into your pelvis. You may experience irregular, often painless, contractions that go away when you walk around or lie down (Braxton Hicks contractions). This is also called false labor. °The first stage of labor begins when you start having contractions that come at regular (evenly spaced) intervals and your cervix starts to get thinner and wider in preparation for your baby to pass through. Birth care providers measure the dilation of your cervix in centimeters (cm). One centimeter is a little less than one-half of an inch. The first stage ends when your cervix is dilated to 10 cm. The first stage of labor is divided into three phases: °· Early phase. °· Active phase. °· Transitional phase. °The length of the first stage of labor varies. It may be longer if this is your first pregnancy. You may spend most of this stage at home trying to relax and stay comfortable. °How does this affect me? °During the first stage of labor, you will move through three phases. °What happens in the early phase? °· You will start to have  regular contractions that last 30-60 seconds. Contractions may come every 5-20 minutes. Keep track of your contractions and call your birth care provider. °· Your water may break during this phase. °· You may notice a clear or slightly bloody discharge of mucus (mucus plug) from your vagina. °· Your cervix will dilate to 3-6 cm. °What happens in the active phase? °The active phase usually lasts 3-5 hours. You may go to the hospital or birth center around this time. During the active phase: °· Your contractions will become stronger, longer, and more uncomfortable. °· Your contractions may last 45-90 seconds and come every 3-5 minutes. °· You may feel lower back pain. °· Your birth care providers may examine your cervix and feel your belly to find the position of your baby. °· You may have a monitor strapped to your belly to measure your contractions and your baby's heart rate. °· You may start using your pain management options. °· Your cervix may be dilated to 6 cm and may start to dilate more quickly. °What happens in the transitional phase? °The transitional phase typically lasts from 30 minutes to 2 hours. At the end of this phase, your cervix will be fully dilated to 10 cm. During the transitional phase: °· Contractions will get stronger and longer. °· Contractions may last 60-90 seconds and come less than 2 minutes apart. °· You may feel hot flashes, chills, or nausea. °How does this affect my baby? °During the first stage of labor, your baby will   gradually move down into your birth canal. °Follow these instructions at home and in the hospital or birth center: ° °· When labor first begins, try to stay calm. You are still in the early phase. If it is night, try to get some sleep. If it is day, try to relax and save your energy. You may want to make some calls and get ready to go to the hospital or birth center. °· When you are in the early phase, try these methods to help ease discomfort: °? Deep breathing and  muscle relaxation. °? Taking a walk. °? Taking a warm bath or shower. °· Drink some fluids and have a light snack if you feel like it. °· Keep track of your contractions. °· Based on the plan you created with your birth care provider, call when your contractions indicate it is time. °· If your water breaks, note the time, color, and odor of the fluid. °· When you are in the active phase, do your breathing exercises and rely on your support people and your team of birth care providers. °Contact a health care provider if: °· Your contractions are strong and regular. °· You have lower back pain or cramping. °· Your water breaks. °· You lose your mucus plug. °Get help right away if you: °· Have a severe headache that does not go away. °· Have changes in your vision. °· Have severe pain in your upper belly. °· Do not feel the baby move. °· Have bright red bleeding. °Summary °· The first stage of labor starts when true labor begins, and it ends when your cervix is dilated to 10 cm. °· The first stage of labor has three phases: early, active, and transitional. °· Your baby moves into the birth canal during the first stage of labor. °· You may have contractions that become stronger and longer. You may also lose your mucus plug and have your water break. °· Call your birth care provider when your contractions are frequent and strong enough to go to the hospital or birth center. °This information is not intended to replace advice given to you by your health care provider. Make sure you discuss any questions you have with your health care provider. °Document Released: 06/17/2017 Document Revised: 12/22/2017 Document Reviewed: 06/17/2017 °Elsevier Interactive Patient Education © 2019 [employer] ° °

## 2018-07-30 NOTE — MAU Provider Note (Addendum)
History     CSN: [id number]  Arrival date and time: 07/30/18 8016   First Provider Initiated Contact with Patient 07/30/18 1029      Chief Complaint  Patient presents with  . Fall  . Contractions  . Headache  . Dizziness   Joanne Huff is a 28 y.o. G2P1001 at [redacted]w[redacted]d who presents for Fall; Contractions; Headache; and Dizziness.  She reports that she fell last night around midnight while going to the restroom.  She states she is unsure of whether she hit her head, but states she fell on her left side and did not hit her abdomen.  Patient reports she got up immediately and did not lose consciousness or have any issues with ambulating after the incident.  However, she reports that she woke up with a headache, leg pain, and contractions. She states that she has soreness behind her left ear, in her left leg, and in her lower abdomen, bilaterally.  She also states that "I have the tender legs and I can barely walk."  She reports that her headache is 8/10 and states the abdominal pain "is like a menstrual cramp," while endorsing that it "comes and goes."  She states she takes baby aspirin for history of BP, but has not had it in a week because she has not had a refill.       OB History    Gravida  2   Para  1   Term  1   Preterm      AB      Living  1     SAB      TAB      Ectopic      Multiple  0   Live Births  1           Past Medical History:  Diagnosis Date  . Chlamydia   . Headache   . High risk HPV infection   . HSV-1 infection   . Infection    UTI  . Marijuana use   . Ovarian cyst   . Pregnancy induced hypertension   . Smoker   . Trichimoniasis     Past Surgical History:  Procedure Laterality Date  . NO PAST SURGERIES      Family History  Problem Relation Age of Onset  . Hypertension Mother   . Stroke Mother   . Arthritis Father   . Hypertension Father   . Hypertension Maternal Grandmother   . Diabetes Maternal Grandmother   . Diabetes  Paternal Grandmother     Social History   Tobacco Use  . Smoking status: Former Smoker    Packs/day: 0.50    Years: 13.00    Pack years: 6.50    Types: Cigarettes  . Smokeless tobacco: Never Used  Substance Use Topics  . Alcohol use: No  . Drug use: Not Currently    Types: Marijuana    Comment: Last marijuana March 2020    Allergies: No Known Allergies  Medications Prior to Admission  Medication Sig Dispense Refill Last Dose  . aspirin EC 81 MG tablet Take 81 mg by mouth daily.   Past Month at Unknown time  . acetaminophen-codeine (TYLENOL #3) 300-30 MG tablet Take 1 tablet by mouth every 6 (six) hours as needed for moderate pain. 10 tablet 0   . valACYclovir (VALTREX) 1000 MG tablet Take 500 mg by mouth daily.   Unknown at Unknown time    Review of Systems  Constitutional: Negative for  chills and fever.  Respiratory: Negative for cough and shortness of breath.   Gastrointestinal: Positive for abdominal pain. Negative for constipation, diarrhea, nausea and vomiting.  Genitourinary: Negative for dysuria, vaginal bleeding and vaginal discharge.  Neurological: Positive for light-headedness and headaches. Negative for dizziness.   Physical Exam   Blood pressure 138/68, pulse 73, temperature 98.3 F (36.8 C), resp. rate 18, height 5' 8.5" (1.74 m), weight 121.9 kg, last menstrual period 11/19/2017, SpO2 100 %.  Physical Exam  Constitutional: She is oriented to person, place, and time. She appears well-developed and well-nourished.  HENT:  Head: Normocephalic and atraumatic.  Eyes: Conjunctivae are normal.  Neck: Normal range of motion.  Cardiovascular: Normal rate and regular rhythm.  Respiratory: Effort normal.  GI: Soft.  Musculoskeletal:     Left hip: She exhibits decreased range of motion and tenderness.     Left upper leg: She exhibits tenderness. She exhibits no swelling.  Neurological: She is alert and oriented to person, place, and time.  Skin: Skin is warm and  dry.  Psychiatric: She has a normal mood and affect. Her behavior is normal.    Fetal Assessment 140 bpm, Mod Var, -Decels, +Accels Toco: None graphed  MAU Course   Results for orders placed or performed during the hospital encounter of 07/30/18 (from the past 24 hour(s))  Urinalysis, Routine w reflex microscopic     Status: Abnormal   Collection Time: 07/30/18  9:46 AM  Result Value Ref Range   Color, Urine YELLOW YELLOW   APPearance CLOUDY (A) CLEAR   Specific Gravity, Urine 1.019 1.005 - 1.030   pH 6.0 5.0 - 8.0   Glucose, UA NEGATIVE NEGATIVE mg/dL   Hgb urine dipstick NEGATIVE NEGATIVE   Bilirubin Urine NEGATIVE NEGATIVE   Ketones, ur NEGATIVE NEGATIVE mg/dL   Protein, ur NEGATIVE NEGATIVE mg/dL   Nitrite NEGATIVE NEGATIVE   Leukocytes,Ua SMALL (A) NEGATIVE   RBC / HPF 0-5 0 - 5 RBC/hpf   WBC, UA 0-5 0 - 5 WBC/hpf   Bacteria, UA RARE (A) NONE SEEN   Squamous Epithelial / LPF 21-50 0 - 5   Mucus PRESENT    No results found.  MDM PE Labs:UA EFM Pain Medication Baby Aspirin Assessment and Plan  28 year old G2P1001 at 36.1 weeks S/P Fall w/o abdominal trauma Headache Contractions Cat I FT  -Exam findings discussed -Informed that symptoms of headache and pain may be related to fall. -Discussed labor precautions, patient reassured that fetal status appears reassuring based upon monitoring. -Encouraged to increase hydration at home and rest. -Discussed and offered tylenol for pain; patient accepts. -Discussed need to obtain baby aspirin and that pharmacy visits are considered essential travel during stay at home orders. -Will give baby aspirin today for re initiation of regime.  -Patient verbalized understanding and without questions or concerns.  -NST reactive -Will reassess  Follow Up (11:21 AM)  -Patient reports improvement in "tension" in head. -Reports continued pain in left leg. -Instructed to report to pharmacy, pick up aspirin and go home and  rest. -No questions or concerns. -Keep appt as scheduled for Thursday April 16 with [facility] -Encouraged to call or return to MAU if symptoms worsen or with the onset of new symptoms. -Discharged to home in improved condition  Cherre RobinsJessica L Cannon Arreola MSN, CNM 07/30/2018, 10:29 AM

## 2018-08-05 ENCOUNTER — Telehealth (HOSPITAL_COMMUNITY): Payer: Self-pay | Admitting: *Deleted

## 2018-08-05 NOTE — Telephone Encounter (Signed)
Preadmission screen  

## 2018-08-06 ENCOUNTER — Other Ambulatory Visit: Payer: Self-pay | Admitting: Advanced Practice Midwife

## 2018-08-08 ENCOUNTER — Encounter (HOSPITAL_COMMUNITY): Payer: Self-pay | Admitting: Anesthesiology

## 2018-08-08 ENCOUNTER — Encounter (HOSPITAL_COMMUNITY)
Admission: RE | Admit: 2018-08-08 | Discharge: 2018-08-08 | Disposition: A | Discharge status: Home / Self Care | Payer: Medicaid Other | Source: Ambulatory Visit | Attending: Obstetrics & Gynecology | Admitting: Obstetrics & Gynecology

## 2018-08-08 ENCOUNTER — Encounter (HOSPITAL_COMMUNITY): Payer: Self-pay

## 2018-08-08 ENCOUNTER — Other Ambulatory Visit: Payer: Self-pay | Admitting: Obstetrics and Gynecology

## 2018-08-08 ENCOUNTER — Inpatient Hospital Stay (HOSPITAL_COMMUNITY)
Admission: AD | Admit: 2018-08-08 | Discharge: 2018-08-08 | Disposition: A | Discharge status: Home / Self Care | Payer: Medicaid Other | Attending: Obstetrics and Gynecology | Admitting: Obstetrics and Gynecology

## 2018-08-08 DIAGNOSIS — Z3A37 37 weeks gestation of pregnancy: Secondary | ICD-10-CM | POA: Insufficient documentation

## 2018-08-08 DIAGNOSIS — Z01812 Encounter for preprocedural laboratory examination: Secondary | ICD-10-CM | POA: Diagnosis not present

## 2018-08-08 DIAGNOSIS — O321XX0 Maternal care for breech presentation, not applicable or unspecified: Secondary | ICD-10-CM | POA: Insufficient documentation

## 2018-08-08 DIAGNOSIS — Z87891 Personal history of nicotine dependence: Secondary | ICD-10-CM | POA: Insufficient documentation

## 2018-08-08 DIAGNOSIS — O329XX0 Maternal care for malpresentation of fetus, unspecified, not applicable or unspecified: Secondary | ICD-10-CM

## 2018-08-08 LAB — CBC WITH DIFFERENTIAL/PLATELET
Abs Immature Granulocytes: 0.02 10*3/uL (ref 0.00–0.07)
Basophils Absolute: 0 10*3/uL (ref 0.0–0.1)
Basophils Relative: 0 %
Eosinophils Absolute: 0.1 10*3/uL (ref 0.0–0.5)
Eosinophils Relative: 1 %
HCT: 34.2 % — ABNORMAL LOW (ref 36.0–46.0)
Hemoglobin: 10.9 g/dL — ABNORMAL LOW (ref 12.0–15.0)
Immature Granulocytes: 0 %
Lymphocytes Relative: 32 %
Lymphs Abs: 2.1 10*3/uL (ref 0.7–4.0)
MCH: 29.1 pg (ref 26.0–34.0)
MCHC: 31.9 g/dL (ref 30.0–36.0)
MCV: 91.4 fL (ref 80.0–100.0)
Monocytes Absolute: 0.6 10*3/uL (ref 0.1–1.0)
Monocytes Relative: 10 %
Neutro Abs: 3.6 10*3/uL (ref 1.7–7.7)
Neutrophils Relative %: 57 %
Platelets: 307 10*3/uL (ref 150–400)
RBC: 3.74 MIL/uL — ABNORMAL LOW (ref 3.87–5.11)
RDW: 13.6 % (ref 11.5–15.5)
WBC: 6.4 10*3/uL (ref 4.0–10.5)
nRBC: 0 % (ref 0.0–0.2)

## 2018-08-08 LAB — TYPE AND SCREEN
ABO/RH(D): A POS
Antibody Screen: NEGATIVE

## 2018-08-08 MED ORDER — TERBUTALINE SULFATE 1 MG/ML IJ SOLN
INTRAMUSCULAR | Status: AC
  Filled 2018-08-08: qty 1

## 2018-08-08 MED ORDER — TERBUTALINE SULFATE 1 MG/ML IJ SOLN
0.2500 mg | Freq: Once | INTRAMUSCULAR | Status: AC
  Administered 2018-08-08: 0.25 mg via SUBCUTANEOUS

## 2018-08-08 MED ORDER — LIDOCAINE HCL (PF) 1 % IJ SOLN
INTRAMUSCULAR | Status: AC
  Filled 2018-08-08: qty 5

## 2018-08-08 MED ORDER — TERBUTALINE SULFATE 1 MG/ML IJ SOLN
0.2500 mg | Freq: Once | INTRAMUSCULAR | Status: DC
  Filled 2018-08-08: qty 1

## 2018-08-08 NOTE — H&P (Signed)
Joanne Huff is a 28 y.o. female G2P1 at 53w3 presenting for scheduled ECV. Patient with [facility] care at the [facility] complicated by a history of preeclampsia. Patient reports feeling well and denies any complaints. She reports good fetal movement. She denies vaginal bleeding, leakage of fluid or regular contractions.  OB History    Gravida  2   Para  1   Term  1   Preterm      AB      Living  1     SAB      TAB      Ectopic      Multiple  0   Live Births  1          Past Medical History:  Diagnosis Date  . Chlamydia   . Headache   . High risk HPV infection   . HSV-1 infection   . Infection    UTI  . Marijuana use   . Ovarian cyst   . Pregnancy induced hypertension   . Smoker   . Trichimoniasis    Past Surgical History:  Procedure Laterality Date  . NO PAST SURGERIES     Family History: family history includes Arthritis in her father; Diabetes in her maternal grandmother and paternal grandmother; Hypertension in her father, maternal grandmother, and mother; Stroke in her mother. Social History:  reports that she has quit smoking. Her smoking use included cigarettes. She has a 6.50 pack-year smoking history. She has never used smokeless tobacco. She reports previous drug use. Drug: Marijuana. She reports that she does not drink alcohol.     Maternal Diabetes: No Genetic Screening: Normal Maternal Ultrasounds/Referrals: Normal Fetal Ultrasounds or other Referrals:  None Maternal Substance Abuse:  No Significant Maternal Medications:  None Significant Maternal Lab Results:  None Other Comments:  None  ROS  See pertinent in HPI History   Last menstrual period 11/19/2017. Exam Physical Exam  GENERAL: Well-developed, well-nourished female in no acute distress.  HEENT: Normocephalic, atraumatic. Sclerae anicteric.  NECK: Supple. Normal thyroid.  LUNGS: Clear to auscultation bilaterally.  HEART: Regular rate and rhythm. ABDOMEN: Soft,  nontender, nondistended. No organomegaly. PELVIC: Not performed EXTREMITIES: No cyanosis, clubbing, or edema, 2+ distal pulses.  FHT: baseline 135, mod variability, +accels, no decels Toco: no contractions  Prenatal labs: ABO, Rh: --/--/A POS (04/23 2094) Antibody: NEG (04/23 0754) Rubella:  Immune RPR:   NR HBsAg:   Neg HIV:   NR GBS:     Assessment/Plan: 28 yo G2P1 at [redacted]w[redacted]d with fetal malpresentation here for ECV - risks, benefits and alternatives were explained including but not limited to risks of fetal intolerance and need for STAT cesarean section - Patient verbalized understanding and all questions were answered  Shona Pardo 08/08/2018, 9:46 AM

## 2018-08-08 NOTE — OR Nursing (Signed)
Pt unsuccessful version. Dr Jolayne Panther speaking with pt about schedule c section. fhr heart rate reassuring and reactive

## 2018-08-08 NOTE — Progress Notes (Signed)
External Cephalic Version  Preprocedure Diagnosis:  28 y.o. G2P1 at 67.[redacted] weeks gestational age with breech presentation  Post-procedure Diagnosis: 28 y.o. G2P1 at 21.[redacted] weeks gestational age with breech presentation  Procedure:  Failed version  Procedure in detail:   The patient was brought to Labor and Delivery where a reactive fetal heart tracing was obtained. The patient was noted to have irregular contractions. She was given 1 dose of subcutaneous terbutaline which resolved her contraction. A bedside ultrasound was performed which revealed single intrauterine pregnancy and breech presentation. There was noted to be adequate fluid. Using manual pressure, the breech was manipulated in a forward roll fashion until a vertex presentation was obtained. Fetal heart tones were checked intermittently during the procedure and were noted to be reassuring. Following unsuccessful external cephalic version, the patient was placed on continuous external fetal monitoring. She was noted to have a reassuring and reactive tracing for 1 hour following the failed version. She did not have regular contractions and therefore she was felt to be stable for discharge to home. She was given appropriate labor instructions. Patient desires to be scheduled for primary cesarean section which has been scheduled on 08/20/18 at 10 am with Dr. Adrian Blackwater. Patient instructed to arrive at 8 am. Patient is scheduled to follow up at health department on Monday. Patient with elevated BP on arrival and asymptomatic. Monitor BP closely

## 2018-08-08 NOTE — OR Nursing (Signed)
Pt discharge home stable vitals. Discharge instructions given

## 2018-08-08 NOTE — Patient Instructions (Signed)
Shaquavia L Bayless  08/08/2018   Your procedure is scheduled on:  08/20/2018  Arrive at 0745 at Entrance C on [address] at [facility]  and [employer]. You are invited to use the FREE valet parking or use the Visitor's parking deck.  Pick up the phone at the desk and dial [phone].  Call this number if you have problems the morning of surgery: [phone]  Remember:   Do not eat food:(After Midnight) Desps de medianoche.  Do not drink clear liquids: (After Midnight) Desps de medianoche.  Take these medicines the morning of surgery with A SIP OF WATER:  valtrex   Do not wear jewelry, make-up or nail polish.  Do not wear lotions, powders, or perfumes. Do not wear deodorant.  Do not shave 48 hours prior to surgery.  Do not bring valuables to the hospital.  [facility] is not   responsible for any belongings or valuables brought to the hospital.  Contacts, dentures or bridgework may not be worn into surgery.  Leave suitcase in the car. After surgery it may be brought to your room.  For patients admitted to the hospital, checkout time is 11:00 AM the day of              discharge.      Please read over the following fact sheets that you were given:     Preparing for Surgery

## 2018-08-09 LAB — ABO/RH: ABO/RH(D): A POS

## 2018-08-10 ENCOUNTER — Other Ambulatory Visit: Payer: Self-pay

## 2018-08-13 ENCOUNTER — Telehealth (HOSPITAL_COMMUNITY): Payer: Self-pay | Admitting: *Deleted

## 2018-08-13 NOTE — Telephone Encounter (Signed)
Preadmission screen  

## 2018-08-14 ENCOUNTER — Encounter (HOSPITAL_COMMUNITY): Payer: Self-pay

## 2018-08-19 ENCOUNTER — Encounter (HOSPITAL_COMMUNITY): Admit: 2018-08-19 | Discharge: 2018-08-19 | Disposition: A | Discharge status: Home / Self Care | Payer: Medicaid Other

## 2018-08-19 HISTORY — DX: Unspecified abnormal cytological findings in specimens from vagina: R87.629

## 2018-08-20 ENCOUNTER — Inpatient Hospital Stay (HOSPITAL_COMMUNITY)
Admission: RE | Admit: 2018-08-20 | Discharge: 2018-08-23 | DRG: 787 | Disposition: A | Discharge status: Home / Self Care | Payer: Medicaid Other | Attending: Family Medicine | Admitting: Family Medicine

## 2018-08-20 ENCOUNTER — Encounter (HOSPITAL_COMMUNITY): Payer: Self-pay | Admitting: *Deleted

## 2018-08-20 ENCOUNTER — Encounter (HOSPITAL_COMMUNITY)
Admission: RE | Disposition: A | Discharge status: Home / Self Care | Payer: Self-pay | Source: Home / Self Care | Attending: Family Medicine

## 2018-08-20 ENCOUNTER — Other Ambulatory Visit: Payer: Self-pay

## 2018-08-20 ENCOUNTER — Inpatient Hospital Stay (HOSPITAL_COMMUNITY): Payer: Medicaid Other | Admitting: Anesthesiology

## 2018-08-20 ENCOUNTER — Encounter (HOSPITAL_COMMUNITY)
Admission: AD | Disposition: A | Discharge status: Home / Self Care | Payer: Self-pay | Source: Home / Self Care | Attending: Obstetrics and Gynecology

## 2018-08-20 ENCOUNTER — Inpatient Hospital Stay (HOSPITAL_COMMUNITY): Admission: AD | Admit: 2018-08-20 | Payer: Medicaid Other | Source: Home / Self Care | Admitting: Family Medicine

## 2018-08-20 DIAGNOSIS — O1493 Unspecified pre-eclampsia, third trimester: Secondary | ICD-10-CM

## 2018-08-20 DIAGNOSIS — O9081 Anemia of the puerperium: Secondary | ICD-10-CM | POA: Diagnosis not present

## 2018-08-20 DIAGNOSIS — O329XX0 Maternal care for malpresentation of fetus, unspecified, not applicable or unspecified: Secondary | ICD-10-CM | POA: Diagnosis present

## 2018-08-20 DIAGNOSIS — Z87891 Personal history of nicotine dependence: Secondary | ICD-10-CM

## 2018-08-20 DIAGNOSIS — O321XX1 Maternal care for breech presentation, fetus 1: Secondary | ICD-10-CM

## 2018-08-20 DIAGNOSIS — Z98891 History of uterine scar from previous surgery: Secondary | ICD-10-CM

## 2018-08-20 DIAGNOSIS — O321XX0 Maternal care for breech presentation, not applicable or unspecified: Secondary | ICD-10-CM | POA: Diagnosis not present

## 2018-08-20 DIAGNOSIS — Z3A39 39 weeks gestation of pregnancy: Secondary | ICD-10-CM

## 2018-08-20 DIAGNOSIS — D62 Acute posthemorrhagic anemia: Secondary | ICD-10-CM | POA: Diagnosis not present

## 2018-08-20 LAB — CBC
HCT: 34.9 % — ABNORMAL LOW (ref 36.0–46.0)
Hemoglobin: 11.1 g/dL — ABNORMAL LOW (ref 12.0–15.0)
MCH: 28.8 pg (ref 26.0–34.0)
MCHC: 31.8 g/dL (ref 30.0–36.0)
MCV: 90.6 fL (ref 80.0–100.0)
Platelets: 304 10*3/uL (ref 150–400)
RBC: 3.85 MIL/uL — ABNORMAL LOW (ref 3.87–5.11)
RDW: 13.7 % (ref 11.5–15.5)
WBC: 6.6 10*3/uL (ref 4.0–10.5)
nRBC: 0 % (ref 0.0–0.2)

## 2018-08-20 LAB — TYPE AND SCREEN
ABO/RH(D): A POS
Antibody Screen: NEGATIVE

## 2018-08-20 LAB — RPR: RPR Ser Ql: NONREACTIVE

## 2018-08-20 SURGERY — Surgical Case
Anesthesia: Regional

## 2018-08-20 SURGERY — Surgical Case
Anesthesia: Spinal | Site: Abdomen | Wound class: Clean Contaminated

## 2018-08-20 MED ORDER — ONDANSETRON HCL 4 MG/2ML IJ SOLN: 4.0000 mg | Freq: Three times a day (TID) | INTRAMUSCULAR | Status: DC | PRN

## 2018-08-20 MED ORDER — NALOXONE HCL 4 MG/10ML IJ SOLN
1.0000 ug/kg/h | INTRAVENOUS | Status: DC | PRN
  Filled 2018-08-20: qty 5

## 2018-08-20 MED ORDER — SODIUM CHLORIDE 0.9 % IV SOLN
INTRAVENOUS | Status: DC | PRN
  Administered 2018-08-20: 10:00:00 60 ug/min via INTRAVENOUS

## 2018-08-20 MED ORDER — FENTANYL CITRATE (PF) 100 MCG/2ML IJ SOLN
INTRAMUSCULAR | Status: DC | PRN
  Administered 2018-08-20: 15 ug via INTRATHECAL

## 2018-08-20 MED ORDER — NALBUPHINE HCL 10 MG/ML IJ SOLN: 5.0000 mg | Freq: Once | INTRAMUSCULAR | Status: DC | PRN

## 2018-08-20 MED ORDER — NALBUPHINE HCL 10 MG/ML IJ SOLN: 5.0000 mg | INTRAMUSCULAR | Status: DC | PRN

## 2018-08-20 MED ORDER — SIMETHICONE 80 MG PO CHEW
80.0000 mg | CHEWABLE_TABLET | ORAL | Status: DC | PRN
  Administered 2018-08-20: 18:00:00 80 mg via ORAL

## 2018-08-20 MED ORDER — MEPERIDINE HCL 25 MG/ML IJ SOLN: 6.2500 mg | INTRAMUSCULAR | Status: DC | PRN

## 2018-08-20 MED ORDER — SODIUM CHLORIDE 0.9 % IR SOLN
Status: DC | PRN
  Administered 2018-08-20: 1

## 2018-08-20 MED ORDER — SIMETHICONE 80 MG PO CHEW
80.0000 mg | CHEWABLE_TABLET | Freq: Three times a day (TID) | ORAL | Status: DC
  Administered 2018-08-21 – 2018-08-22 (×5): 80 mg via ORAL
  Filled 2018-08-20 (×7): qty 1

## 2018-08-20 MED ORDER — MENTHOL 3 MG MT LOZG: 1.0000 | LOZENGE | OROMUCOSAL | Status: DC | PRN

## 2018-08-20 MED ORDER — SCOPOLAMINE 1 MG/3DAYS TD PT72: 1.0000 | MEDICATED_PATCH | Freq: Once | TRANSDERMAL | Status: DC

## 2018-08-20 MED ORDER — DEXTROSE 5 % IV SOLN
3.0000 g | INTRAVENOUS | Status: AC
  Administered 2018-08-20: 10:00:00 3 g via INTRAVENOUS
  Filled 2018-08-20: qty 3000

## 2018-08-20 MED ORDER — OXYTOCIN 40 UNITS IN NORMAL SALINE INFUSION - SIMPLE MED: 2.5000 [IU]/h | INTRAVENOUS | Status: AC

## 2018-08-20 MED ORDER — TETANUS-DIPHTH-ACELL PERTUSSIS 5-2.5-18.5 LF-MCG/0.5 IM SUSP: 0.5000 mL | Freq: Once | INTRAMUSCULAR | Status: DC

## 2018-08-20 MED ORDER — SODIUM CHLORIDE 0.9% FLUSH: 3.0000 mL | INTRAVENOUS | Status: DC | PRN

## 2018-08-20 MED ORDER — ONDANSETRON HCL 4 MG/2ML IJ SOLN: 4.0000 mg | Freq: Once | INTRAMUSCULAR | Status: DC | PRN

## 2018-08-20 MED ORDER — BUPIVACAINE IN DEXTROSE 0.75-8.25 % IT SOLN
INTRATHECAL | Status: DC | PRN
  Administered 2018-08-20: 11.25 mg via INTRATHECAL

## 2018-08-20 MED ORDER — KETOROLAC TROMETHAMINE 30 MG/ML IJ SOLN
INTRAMUSCULAR | Status: AC
  Filled 2018-08-20: qty 1

## 2018-08-20 MED ORDER — KETOROLAC TROMETHAMINE 30 MG/ML IJ SOLN
30.0000 mg | Freq: Four times a day (QID) | INTRAMUSCULAR | Status: AC | PRN
  Administered 2018-08-20: 30 mg via INTRAMUSCULAR

## 2018-08-20 MED ORDER — FENTANYL CITRATE (PF) 100 MCG/2ML IJ SOLN
INTRAMUSCULAR | Status: AC
  Filled 2018-08-20: qty 2

## 2018-08-20 MED ORDER — KETOROLAC TROMETHAMINE 30 MG/ML IJ SOLN
30.0000 mg | Freq: Four times a day (QID) | INTRAMUSCULAR | Status: AC | PRN
  Administered 2018-08-20 – 2018-08-21 (×3): 30 mg via INTRAVENOUS
  Filled 2018-08-20 (×3): qty 1

## 2018-08-20 MED ORDER — DIPHENHYDRAMINE HCL 25 MG PO CAPS: 25.0000 mg | ORAL_CAPSULE | Freq: Four times a day (QID) | ORAL | Status: DC | PRN

## 2018-08-20 MED ORDER — DEXAMETHASONE SODIUM PHOSPHATE 10 MG/ML IJ SOLN
INTRAMUSCULAR | Status: AC
  Filled 2018-08-20: qty 1

## 2018-08-20 MED ORDER — MORPHINE SULFATE (PF) 0.5 MG/ML IJ SOLN
INTRAMUSCULAR | Status: DC | PRN
  Administered 2018-08-20: .15 mg via INTRATHECAL

## 2018-08-20 MED ORDER — DIBUCAINE (PERIANAL) 1 % EX OINT: 1.0000 "application " | TOPICAL_OINTMENT | CUTANEOUS | Status: DC | PRN

## 2018-08-20 MED ORDER — OXYCODONE-ACETAMINOPHEN 5-325 MG PO TABS
1.0000 | ORAL_TABLET | ORAL | Status: DC | PRN
  Administered 2018-08-23: 1 via ORAL
  Filled 2018-08-20: qty 1

## 2018-08-20 MED ORDER — HYDROMORPHONE HCL 1 MG/ML IJ SOLN: 0.2500 mg | INTRAMUSCULAR | Status: DC | PRN

## 2018-08-20 MED ORDER — ONDANSETRON HCL 4 MG/2ML IJ SOLN
INTRAMUSCULAR | Status: AC
  Filled 2018-08-20: qty 2

## 2018-08-20 MED ORDER — LACTATED RINGERS IV SOLN
INTRAVENOUS | Status: DC
  Administered 2018-08-20: 22:00:00 via INTRAVENOUS

## 2018-08-20 MED ORDER — KETOROLAC TROMETHAMINE 30 MG/ML IJ SOLN: 30.0000 mg | Freq: Once | INTRAMUSCULAR | Status: DC | PRN

## 2018-08-20 MED ORDER — SIMETHICONE 80 MG PO CHEW
80.0000 mg | CHEWABLE_TABLET | ORAL | Status: DC
  Administered 2018-08-21 – 2018-08-23 (×3): 80 mg via ORAL
  Filled 2018-08-20 (×3): qty 1

## 2018-08-20 MED ORDER — LACTATED RINGERS IV SOLN
INTRAVENOUS | Status: DC | PRN
  Administered 2018-08-20 (×2): via INTRAVENOUS

## 2018-08-20 MED ORDER — MORPHINE SULFATE (PF) 0.5 MG/ML IJ SOLN
INTRAMUSCULAR | Status: AC
  Filled 2018-08-20: qty 10

## 2018-08-20 MED ORDER — SODIUM CHLORIDE 0.9 % IV SOLN
INTRAVENOUS | Status: DC | PRN
  Administered 2018-08-20: 10:00:00 via INTRAVENOUS

## 2018-08-20 MED ORDER — DIPHENHYDRAMINE HCL 25 MG PO CAPS: 25.0000 mg | ORAL_CAPSULE | ORAL | Status: DC | PRN

## 2018-08-20 MED ORDER — STERILE WATER FOR IRRIGATION IR SOLN
Status: DC | PRN
  Administered 2018-08-20: 1

## 2018-08-20 MED ORDER — CEFAZOLIN SODIUM-DEXTROSE 2-4 GM/100ML-% IV SOLN: 2.0000 g | INTRAVENOUS | Status: DC

## 2018-08-20 MED ORDER — ONDANSETRON HCL 4 MG/2ML IJ SOLN
INTRAMUSCULAR | Status: DC | PRN
  Administered 2018-08-20: 4 mg via INTRAVENOUS

## 2018-08-20 MED ORDER — ZOLPIDEM TARTRATE 5 MG PO TABS: 5.0000 mg | ORAL_TABLET | Freq: Every evening | ORAL | Status: DC | PRN

## 2018-08-20 MED ORDER — ENOXAPARIN SODIUM 40 MG/0.4ML ~~LOC~~ SOLN
40.0000 mg | SUBCUTANEOUS | Status: DC
  Administered 2018-08-21 – 2018-08-23 (×3): 40 mg via SUBCUTANEOUS
  Filled 2018-08-20 (×3): qty 0.4

## 2018-08-20 MED ORDER — SODIUM CHLORIDE 0.9 % IV SOLN
INTRAVENOUS | Status: DC | PRN
  Administered 2018-08-20: 10:00:00 40 [IU] via INTRAVENOUS

## 2018-08-20 MED ORDER — MEASLES, MUMPS & RUBELLA VAC IJ SOLR: 0.5000 mL | Freq: Once | INTRAMUSCULAR | Status: DC

## 2018-08-20 MED ORDER — DEXAMETHASONE SODIUM PHOSPHATE 10 MG/ML IJ SOLN
INTRAMUSCULAR | Status: DC | PRN
  Administered 2018-08-20: 5 mg via INTRAVENOUS

## 2018-08-20 MED ORDER — SENNOSIDES-DOCUSATE SODIUM 8.6-50 MG PO TABS
2.0000 | ORAL_TABLET | ORAL | Status: DC
  Administered 2018-08-21 – 2018-08-23 (×3): 2 via ORAL
  Filled 2018-08-20 (×3): qty 2

## 2018-08-20 MED ORDER — OXYCODONE HCL 5 MG/5ML PO SOLN: 5.0000 mg | Freq: Once | ORAL | Status: DC | PRN

## 2018-08-20 MED ORDER — SCOPOLAMINE 1 MG/3DAYS TD PT72
MEDICATED_PATCH | TRANSDERMAL | Status: AC
  Filled 2018-08-20: qty 1

## 2018-08-20 MED ORDER — COCONUT OIL OIL: 1.0000 "application " | TOPICAL_OIL | Status: DC | PRN

## 2018-08-20 MED ORDER — OXYTOCIN 40 UNITS IN NORMAL SALINE INFUSION - SIMPLE MED
INTRAVENOUS | Status: AC
  Filled 2018-08-20: qty 1000

## 2018-08-20 MED ORDER — WITCH HAZEL-GLYCERIN EX PADS: 1.0000 "application " | MEDICATED_PAD | CUTANEOUS | Status: DC | PRN

## 2018-08-20 MED ORDER — DIPHENHYDRAMINE HCL 50 MG/ML IJ SOLN: 12.5000 mg | INTRAMUSCULAR | Status: DC | PRN

## 2018-08-20 MED ORDER — OXYCODONE HCL 5 MG PO TABS: 5.0000 mg | ORAL_TABLET | Freq: Once | ORAL | Status: DC | PRN

## 2018-08-20 MED ORDER — PRENATAL MULTIVITAMIN CH
1.0000 | ORAL_TABLET | Freq: Every day | ORAL | Status: DC
  Administered 2018-08-21 – 2018-08-22 (×2): 1 via ORAL
  Filled 2018-08-20 (×2): qty 1

## 2018-08-20 MED ORDER — NALOXONE HCL 0.4 MG/ML IJ SOLN: 0.4000 mg | INTRAMUSCULAR | Status: DC | PRN

## 2018-08-20 SURGICAL SUPPLY — 35 items
APL SKNCLS STERI-STRIP NONHPOA (GAUZE/BANDAGES/DRESSINGS) ×1
BENZOIN TINCTURE PRP APPL 2/3 (GAUZE/BANDAGES/DRESSINGS) ×2 IMPLANT
CHLORAPREP W/TINT 26ML (MISCELLANEOUS) ×2 IMPLANT
CLAMP CORD UMBIL (MISCELLANEOUS) IMPLANT
CLOTH BEACON ORANGE TIMEOUT ST (SAFETY) ×2 IMPLANT
DRSG OPSITE POSTOP 4X10 (GAUZE/BANDAGES/DRESSINGS) ×2 IMPLANT
ELECT REM PT RETURN 9FT ADLT (ELECTROSURGICAL) ×2
ELECTRODE REM PT RTRN 9FT ADLT (ELECTROSURGICAL) ×1 IMPLANT
EXTRACTOR VACUUM M CUP 4 TUBE (SUCTIONS) IMPLANT
GLOVE BIOGEL PI IND STRL 7.0 (GLOVE) ×2 IMPLANT
GLOVE BIOGEL PI IND STRL 7.5 (GLOVE) ×2 IMPLANT
GLOVE BIOGEL PI INDICATOR 7.0 (GLOVE) ×2
GLOVE BIOGEL PI INDICATOR 7.5 (GLOVE) ×2
GLOVE ECLIPSE 7.5 STRL STRAW (GLOVE) ×2 IMPLANT
GOWN STRL REUS W/TWL LRG LVL3 (GOWN DISPOSABLE) ×6 IMPLANT
KIT ABG SYR 3ML LUER SLIP (SYRINGE) IMPLANT
NDL HYPO 25X5/8 SAFETYGLIDE (NEEDLE) IMPLANT
NEEDLE HYPO 25X5/8 SAFETYGLIDE (NEEDLE) IMPLANT
NS IRRIG 1000ML POUR BTL (IV SOLUTION) ×2 IMPLANT
PACK C SECTION WH (CUSTOM PROCEDURE TRAY) ×2 IMPLANT
PAD ABD 7.5X8 STRL (GAUZE/BANDAGES/DRESSINGS) ×1 IMPLANT
PAD OB MATERNITY 4.3X12.25 (PERSONAL CARE ITEMS) ×2 IMPLANT
PENCIL SMOKE EVAC W/HOLSTER (ELECTROSURGICAL) ×2 IMPLANT
RTRCTR C-SECT PINK 25CM LRG (MISCELLANEOUS) ×2 IMPLANT
STRIP CLOSURE SKIN 1/2X4 (GAUZE/BANDAGES/DRESSINGS) ×2 IMPLANT
STRIP CLOSURE SKIN 1/4X3 (GAUZE/BANDAGES/DRESSINGS) ×1 IMPLANT
SUT PROLENE 1 CT (SUTURE) ×1 IMPLANT
SUT VIC AB 0 CTX 36 (SUTURE) ×8
SUT VIC AB 0 CTX36XBRD ANBCTRL (SUTURE) ×3 IMPLANT
SUT VIC AB 2-0 CT1 27 (SUTURE) ×2
SUT VIC AB 2-0 CT1 TAPERPNT 27 (SUTURE) ×1 IMPLANT
SUT VIC AB 4-0 KS 27 (SUTURE) ×2 IMPLANT
TOWEL OR 17X24 6PK STRL BLUE (TOWEL DISPOSABLE) ×2 IMPLANT
TRAY FOLEY W/BAG SLVR 14FR LF (SET/KITS/TRAYS/PACK) ×2 IMPLANT
WATER STERILE IRR 1000ML POUR (IV SOLUTION) ×2 IMPLANT

## 2018-08-20 NOTE — Discharge Summary (Addendum)
OB Discharge Summary     Patient Name: Joanne Huff DOB: July 04, 1990 MRN: [id number]  Date of admission: 08/20/2018 Delivering MD: Arvilla MarketWALLACE, CATHERINE LAUREN   Date of discharge: 08/23/2018  Admitting diagnosis: breech Intrauterine pregnancy: 592w1d     Secondary diagnosis:  Active Problems:   Breech presentation of fetus   Failed external cephalic version   Status post primary low transverse cesarean section  Additional problems: none     Discharge diagnosis: Term Pregnancy Delivered                                                                                                Post partum procedures:none  Augmentation: N/A   Complications: None  Hospital course:  Sceduled C/S   28 y.o. yo G2P2002 at 252w1d was admitted to the hospital 08/20/2018 for scheduled cesarean section with the following indication:Malpresentation.  Membrane Rupture Time/Date: 10:25 AM ,08/20/2018   Patient delivered a Viable infant.08/20/2018  Details of operation can be found in separate operative note.  Pateint had an uncomplicated postpartum course.  She is ambulating, tolerating a regular diet, passing flatus, and urinating well. Patient is discharged home in stable condition on  08/23/18         Physical exam  Vitals:   08/22/18 0505 08/22/18 1905 08/22/18 2100 08/23/18 0500  BP: 126/82 129/79 135/72 136/81  Pulse: 64 66 75 67  Resp: 17 18 18 16   Temp: 98.1 F (36.7 C) 99.3 F (37.4 C) 98.7 F (37.1 C) 97.9 F (36.6 C)  TempSrc: Oral Oral  Oral  SpO2:  100%    Weight:      Height:       General: alert, cooperative and no distress Lochia: appropriate Uterine Fundus: firm Incision: Healing well with no significant drainage, Dressing is clean, dry, and intact DVT Evaluation: No evidence of DVT seen on physical exam. Labs: Lab Results  Component Value Date   WBC 11.2 (H) 08/21/2018   HGB 9.8 (L) 08/21/2018   HCT 30.3 (L) 08/21/2018   MCV 89.4 08/21/2018   PLT 265 08/21/2018   CMP Latest  Ref Rng & Units 08/21/2018  Glucose 65 - 99 mg/dL -  BUN 6 - 20 mg/dL -  Creatinine 1.470.44 - 8.291.00 mg/dL 5.620.74  Sodium 130135 - 865145 mmol/L -  Potassium 3.5 - 5.1 mmol/L -  Chloride 101 - 111 mmol/L -  CO2 22 - 32 mmol/L -  Calcium 8.9 - 10.3 mg/dL -  Total Protein 6.5 - 8.1 g/dL -  Total Bilirubin 0.3 - 1.2 mg/dL -  Alkaline Phos 38 - 784126 U/L -  AST 15 - 41 U/L -  ALT 14 - 54 U/L -    Discharge instruction: per After Visit Summary and "Baby and Me Booklet".  After visit meds:  Allergies as of 08/23/2018   No Known Allergies     Medication List    STOP taking these medications   valACYclovir 1000 MG tablet Commonly known as:  VALTREX     TAKE these medications   ibuprofen 600 MG tablet Commonly known as:  ADVIL Take  1 tablet (600 mg total) by mouth 4 (four) times daily.   oxyCODONE-acetaminophen 5-325 MG tablet Commonly known as:  PERCOCET/ROXICET Take 1-2 tablets by mouth every 4 (four) hours as needed for severe pain.       Diet: routine diet  Activity: Advance as tolerated. Pelvic rest for 6 weeks.   Outpatient follow up:4 weeks Follow up Appt:No future appointments. Follow up Visit:No follow-ups on file.  Postpartum contraception: Undecided  Newborn Data: Live born female  Birth Weight:   APGAR: 8, 9  Newborn Delivery   Birth date/time:  08/20/2018 10:27:00 Delivery type:  C-Section, Low Transverse Trial of labor:  No C-section categorization:  Primary     Baby Feeding: Breast Disposition:home with mother   08/23/2018 Wynelle Bourgeois, CNM

## 2018-08-20 NOTE — Anesthesia Procedure Notes (Signed)
Spinal  Patient location during procedure: OB Start time: 08/20/2018 10:02 AM End time: 08/20/2018 10:08 AM Staffing Anesthesiologist: Trevor Iha, MD Performed: anesthesiologist  Preanesthetic Checklist Completed: patient identified, surgical consent, pre-op evaluation, timeout performed, IV checked, risks and benefits discussed and monitors and equipment checked Spinal Block Patient position: sitting Prep: site prepped and draped and DuraPrep Patient monitoring: heart rate, cardiac monitor, continuous pulse ox and blood pressure Approach: midline Location: L3-4 Injection technique: single-shot Needle Needle type: Pencan  Needle gauge: 24 G Needle length: 10 cm Needle insertion depth: 7 cm Assessment Sensory level: T4 Additional Notes   1 Attempt (s). Pt tolerated procedure well.

## 2018-08-20 NOTE — Anesthesia Preprocedure Evaluation (Signed)
Anesthesia Evaluation  Patient identified by MRN, date of birth, ID band Patient awake    Reviewed: Allergy & Precautions, NPO status , Patient's Chart, lab work & pertinent test results  Airway Mallampati: II  TM Distance: >3 FB Neck ROM: Full    Dental no notable dental hx. (+) Teeth Intact   Pulmonary former smoker,    Pulmonary exam normal breath sounds clear to auscultation       Cardiovascular hypertension, Normal cardiovascular exam Rhythm:Regular Rate:Normal     Neuro/Psych  Headaches,    GI/Hepatic negative GI ROS,   Endo/Other  negative endocrine ROS  Renal/GU negative Renal ROS     Musculoskeletal   Abdominal (+) + obese,   Peds  Hematology   Anesthesia Other Findings   Reproductive/Obstetrics (+) Pregnancy                             Lab Results  Component Value Date   WBC 6.6 08/20/2018   HGB 11.1 (L) 08/20/2018   HCT 34.9 (L) 08/20/2018   MCV 90.6 08/20/2018   PLT 304 08/20/2018    Anesthesia Physical Anesthesia Plan  ASA: III  Anesthesia Plan: Spinal   Post-op Pain Management:    Induction:   PONV Risk Score and Plan: Treatment may vary due to age or medical condition  Airway Management Planned: Natural Airway and Simple Face Mask  Additional Equipment:   Intra-op Plan:   Post-operative Plan:   Informed Consent: I have reviewed the patients History and Physical, chart, labs and discussed the procedure including the risks, benefits and alternatives for the proposed anesthesia with the patient or authorized representative who has indicated his/her understanding and acceptance.     Dental advisory given  Plan Discussed with:   Anesthesia Plan Comments: (C/S for breech)        Anesthesia Quick Evaluation

## 2018-08-20 NOTE — Transfer of Care (Signed)
Immediate Anesthesia Transfer of Care Note  Patient: Joanne Huff  Procedure(s) Performed: CESAREAN SECTION (N/A Abdomen)  Patient Location: PACU  Anesthesia Type:Spinal  Level of Consciousness: awake, alert  and oriented  Airway & Oxygen Therapy: Patient Spontanous Breathing  Post-op Assessment: Report given to RN and Post -op Vital signs reviewed and stable  Post vital signs: Reviewed and stable  Last Vitals:  Vitals Value Taken Time  BP    Temp    Pulse    Resp    SpO2      Last Pain:  Vitals:   08/20/18 0741  TempSrc: Oral         Complications: No apparent anesthesia complications

## 2018-08-20 NOTE — Lactation Note (Signed)
This note was copied from a baby's chart. Lactation Consultation Note  Patient Name: Joanne Huff [id number] Date: 08/20/2018 Reason for consult: Initial assessment;Term;1st time breastfeeding  6 hours old FT female who is being exclusively BF by her mother, she's a P2 but not experienced BF, she didn't BF her first child, this is her first time BF. Mom had a UDS (+) for [facility] on 03/04/18, 06/11/18 and 08/01/2018 per NP note; dugs screening still pending. Mom participated in the [facility] program at the [facility] during the pregnancy and she's already familiar with hand expression. When reviewing hand expression, she was able to few droplets of colostrum out of her right breast, LC showed parents how to finger fed baby.   When finger feeding noticed that baby was doing both, sucking but also biting on gloved finger from time to time. Mom doesn't have a pump at home and requested a hand pump from the hospital. Instructions, cleaning and storage were reviewed, as well as milk storage guidelines.   Offered assistance with latch and mom agreed to have baby STS, she was already cueing. LC unwrapped her and took baby STS to mother's right breast in cross cradle position and she was able to latch right away. A few audible swallows noted. Baby fed for at least 10 minutes, still nursing when exiting the room. Reviewed normal newborn behavior, cluster feeding and feeding cues.  Feeding plan:  1. Encouraged mom to feed baby STS 8-12 times/24 hours or sooner if feeding cues are present 2. Hand expression and spoon/finger feeding was also encouraged  BF brochure, BF resources and feeding diary were reviewed. Parents reported all questions and concerns were answered, they're both aware of LC services and will call PRN.    Maternal Data Formula Feeding for Exclusion: No Has patient been taught Hand Expression?: Yes Does the patient have breastfeeding experience prior to this delivery?: No(She didn't BF her first  child)  Feeding Feeding Type: Breast Fed  LATCH Score Latch: Grasps breast easily, tongue down, lips flanged, rhythmical sucking.  Audible Swallowing: A few with stimulation  Type of Nipple: Everted at rest and after stimulation  Comfort (Breast/Nipple): Soft / non-tender  Hold (Positioning): Assistance needed to correctly position infant at breast and maintain latch.  LATCH Score: 8  Interventions Interventions: Breast feeding basics reviewed;Assisted with latch;Skin to skin;Breast massage;Hand express;Breast compression;Support pillows;Hand pump  Lactation Tools Discussed/Used Tools: Pump Breast pump type: Manual WIC Program: Yes Pump Review: Setup, frequency, and cleaning;Milk Storage Initiated by:: MPeck Date initiated:: 08/20/18   Consult Status Consult Status: Follow-up Date: 08/21/18 Follow-up type: In-patient    Syan Cullimore Venetia Constable 08/20/2018, 4:34 PM

## 2018-08-20 NOTE — H&P (Signed)
Faculty Practice H&P  Joanne Huff is a 28 y.o. female G2P1001 with IUP at 3574w1d presenting for primary cesarean section for breech position. Pregnancy was been uncomplicated.    Pt states she has been having no contractions, no vaginal bleeding, intact membranes, with normal fetal movement.     Prenatal Course Source of Care: [facility]   Pregnancy complications or risks: Patient Active Problem List   Diagnosis Date Noted  . Breech presentation of fetus 08/08/2018  . Failed external cephalic version 16/10/960404/23/2020  . Preeclampsia 09/01/2014   She desires nexplanon for contraception.  She plans to breastfeed  Prenatal labs and studies: ABO, Rh: --/--/A POS (05/05 [id number]) Antibody: NEG (05/05 0748) Rubella:  immune RPR:   NR HBsAg:   Neg HIV:   NR GBS:   Neg 2hr Glucola: negative Genetic screening: normal Anatomy US: normal  Past Medical History:  Past Medical History:  Diagnosis Date  . Chlamydia   . Headache   . High risk HPV infection   . HSV-1 infection   . Infection    UTI  . Marijuana use   . Ovarian cyst   . Pregnancy induced hypertension   . Smoker   . Trichimoniasis   . Vaginal Pap smear, abnormal     Past Surgical History:  Past Surgical History:  Procedure Laterality Date  . NO PAST SURGERIES      Obstetrical History:  OB History    Gravida  2   Para  1   Term  1   Preterm      AB      Living  1     SAB      TAB      Ectopic      Multiple  0   Live Births  1           Gynecological History:  OB History    Gravida  2   Para  1   Term  1   Preterm      AB      Living  1     SAB      TAB      Ectopic      Multiple  0   Live Births  1           Social History:  Social History   Socioeconomic History  . Marital status: Single    Spouse name: Not on file  . Number of children: Not on file  . Years of education: Not on file  . Highest education level: Not on file  Occupational History  . Not on file   Social Needs  . Financial resource strain: Not hard at all  . Food insecurity:    Worry: Never true    Inability: Never true  . Transportation needs:    Medical: No    Non-medical: Not on file  Tobacco Use  . Smoking status: Former Smoker    Packs/day: 0.50    Years: 13.00    Pack years: 6.50    Types: Cigarettes  . Smokeless tobacco: Never Used  Substance and Sexual Activity  . Alcohol use: No  . Drug use: Not Currently    Types: Marijuana    Comment: Last marijuana 2 weeks before 4/29  . Sexual activity: Yes    Birth control/protection: None  Lifestyle  . Physical activity:    Days per week: Not on file    Minutes per session: Not on file  .  Stress: Only a little  Relationships  . Social connections:    Talks on phone: Not on file    Gets together: Not on file    Attends religious service: Not on file    Active member of club or organization: Not on file    Attends meetings of clubs or organizations: Not on file    Relationship status: Not on file  Other Topics Concern  . Not on file  Social History Narrative  . Not on file    Family History:  Family History  Problem Relation Age of Onset  . Hypertension Mother   . Stroke Mother   . Arthritis Father   . Hypertension Father   . Hypertension Maternal Grandmother   . Diabetes Maternal Grandmother   . Diabetes Paternal Grandmother     Medications:  Prenatal vitamins,  Current Facility-Administered Medications  Medication Dose Route Frequency Provider Last Rate Last Dose  . ceFAZolin (ANCEF) 3 g in dextrose 5 % 50 mL IVPB  3 g Intravenous On Call to OR Levie Heritage, DO        Allergies: No Known Allergies  Review of Systems: - negative  Physical Exam: Blood pressure (!) 149/83, pulse 71, temperature 98.3 F (36.8 C), temperature source Oral, resp. rate 18, height 5\' 10"  (1.778 m), weight 123.4 kg, last menstrual period 11/19/2017. GENERAL: Well-developed, well-nourished female in no acute distress.   LUNGS: Clear to auscultation bilaterally.  HEART: Regular rate and rhythm. ABDOMEN: Soft, nontender, nondistended, gravid. EFW 8.5 lbs EXTREMITIES: Nontender, no edema, 2+ distal pulses. FHT: 133 bpm   Pertinent Labs/Studies:   Lab Results  Component Value Date   WBC 6.6 08/20/2018   HGB 11.1 (L) 08/20/2018   HCT 34.9 (L) 08/20/2018   MCV 90.6 08/20/2018   PLT 304 08/20/2018    Assessment : Joanne Huff is a 28 y.o. G2P1001 at [redacted]w[redacted]d being admitted for cesarean section secondary to breech position  Plan: The risks of cesarean section discussed with the patient included but were not limited to: bleeding which may require transfusion or reoperation; infection which may require antibiotics; injury to bowel, bladder, ureters or other surrounding organs; injury to the fetus; need for additional procedures including hysterectomy in the event of a life-threatening hemorrhage; placental abnormalities wth subsequent pregnancies, incisional problems, thromboembolic phenomenon and other postoperative/anesthesia complications. The patient concurred with the proposed plan, giving informed written consent for the procedure.   Patient has been NPO since last night and will remain NPO for procedure.  Preoperative prophylactic Ancef ordered on call to the OR.    Levie Heritage, DO 08/20/2018, 8:56 AM

## 2018-08-20 NOTE — Anesthesia Postprocedure Evaluation (Signed)
Anesthesia Post Note  Patient: Joanne Huff  Procedure(s) Performed: CESAREAN SECTION (N/A Abdomen)     Patient location during evaluation: Mother Baby Anesthesia Type: Spinal Level of consciousness: oriented and awake and alert Pain management: pain level controlled Vital Signs Assessment: post-procedure vital signs reviewed and stable Respiratory status: spontaneous breathing and respiratory function stable Cardiovascular status: blood pressure returned to baseline and stable Postop Assessment: no headache, no backache, no apparent nausea or vomiting and able to ambulate Anesthetic complications: no    Last Vitals:  Vitals:   08/20/18 1116 08/20/18 1230  BP:  (!) 145/83  Pulse:  73  Resp:  20  Temp: 36.5 C 36.9 C    Last Pain:  Vitals:   08/20/18 1230  TempSrc: Oral  PainSc: 0-No pain   Pain Goal:                   Trevor Iha

## 2018-08-20 NOTE — Op Note (Signed)
Cesarean Section Operative Report  PATIENT: Joanne Huff  PROCEDURE DATE: 08/20/2018  PREOPERATIVE DIAGNOSES: Intrauterine pregnancy at 7035w1d weeks gestation; malpresentation: breech  POSTOPERATIVE DIAGNOSES: The same  PROCEDURE: Primary Low Transverse Cesarean Section  SURGEON:   Surgeon(s) and Role:    * Joanne Huff, Jacob J, DO - Primary    * Arvilla MarketWallace,  Lauren, DO - OB Fellow   INDICATIONS: Joanne Huff is a 28 y.o. [id number] at 4335w1d here for cesarean section secondary to the indications listed under preoperative diagnoses; please see preoperative note for further details.  The risks of cesarean section were discussed with the patient including but were not limited to: bleeding which may require transfusion or reoperation; infection which may require antibiotics; injury to bowel, bladder, ureters or other surrounding organs; injury to the fetus; need for additional procedures including hysterectomy in the event of a life-threatening hemorrhage; placental abnormalities wth subsequent pregnancies, incisional problems, thromboembolic phenomenon and other postoperative/anesthesia complications.   The patient concurred with the proposed plan, giving informed written consent for the procedure.    FINDINGS:  Viable female infant in breech  presentation.  Apgars 8 and 9.  Clear amniotic fluid.  Intact placenta, three vessel cord.  Normal uterus, fallopian tubes and ovaries bilaterally.  ANESTHESIA: Spinal INTRAVENOUS FLUIDS: 2000 mL  ESTIMATED BLOOD LOSS: 141 mL URINE OUTPUT:  400 ml SPECIMENS: Placenta sent to L&D COMPLICATIONS: None immediate  PROCEDURE IN DETAIL:  The patient preoperatively received intravenous antibiotics and had sequential compression devices applied to her lower extremities.  She was then taken to the operating room where spinal anesthesia was administered and was found to be adequate. She was then placed in a dorsal supine position with a leftward tilt, and prepped  and draped in a sterile manner.  A foley catheter was placed into her bladder and attached to constant gravity.    After an adequate timeout was performed, a Pfannenstiel skin incision was made with scalpel and carried through to the underlying layer of fascia. The fascia was incised in the midline, and this incision was extended bilaterally using the Mayo scissors.  Kocher clamps were applied to the superior aspect of the fascial incision and the underlying rectus muscles were dissected off bluntly.  A similar process was carried out on the inferior aspect of the fascial incision. The rectus muscles were separated in the midline bluntly and the peritoneum was entered bluntly. Attention was turned to the lower uterine segment where a low transverse hysterotomy was made with a scalpel and extended bilaterally bluntly.  The infant was successfully delivered, the cord was clamped and cut after one minute, and the infant was handed over to the awaiting neonatology team. Uterine massage was then administered, and the placenta delivered intact with a three-vessel cord. The uterus was then cleared of clots and debris.  The hysterotomy was closed with 0 Vicryl in a running locked fashion, and an imbricating layer was also placed with 0 Vicryl.  Figure-of-eight 0 Vicryl serosal stitches were placed to help with hemostasis.  The pelvis was cleared of all clot and debris. Hemostasis was confirmed on all surfaces.  The peritoneum was closed with a 0 Vicryl running stitch. The fascia was then closed using 0 Vicryl in a running fashion.  The subcutaneous layer was irrigated, then reapproximated with 2-0 plain gut running stitches. The skin was closed with a 4-0 Vicryl subcuticular stitch. A pressure dressing was applied.   The patient tolerated the procedure well. Sponge, lap, instrument and needle  counts were correct x 3.  She was taken to the recovery room in stable condition.   An experienced assistant was required given  the standard of surgical care given the complexity of the case.  This assistant was needed for exposure, dissection, suctioning, retraction, instrument exchange, assisting with delivery with administration of fundal pressure, and for overall help during the procedure.   Maternal Disposition: PACU - hemodynamically stable.   Infant Disposition: stable   Marcy Siren, D.O. OB Fellow  08/20/2018, 11:09 AM

## 2018-08-21 LAB — CREATININE, SERUM
Creatinine, Ser: 0.74 mg/dL (ref 0.44–1.00)
GFR calc Af Amer: >60 mL/min (ref >60)
GFR calc non Af Amer: >60 mL/min (ref >60)

## 2018-08-21 LAB — CBC
HCT: 30.3 % — ABNORMAL LOW (ref 36.0–46.0)
Hemoglobin: 9.8 g/dL — ABNORMAL LOW (ref 12.0–15.0)
MCH: 28.9 pg (ref 26.0–34.0)
MCHC: 32.3 g/dL (ref 30.0–36.0)
MCV: 89.4 fL (ref 80.0–100.0)
Platelets: 265 10*3/uL (ref 150–400)
RBC: 3.39 MIL/uL — ABNORMAL LOW (ref 3.87–5.11)
RDW: 13.6 % (ref 11.5–15.5)
WBC: 11.2 10*3/uL — ABNORMAL HIGH (ref 4.0–10.5)
nRBC: 0 % (ref 0.0–0.2)

## 2018-08-21 LAB — BIRTH TISSUE RECOVERY COLLECTION (PLACENTA DONATION)

## 2018-08-21 MED ORDER — IBUPROFEN 600 MG PO TABS
600.0000 mg | ORAL_TABLET | Freq: Four times a day (QID) | ORAL | Status: DC
  Administered 2018-08-21 – 2018-08-23 (×8): 600 mg via ORAL
  Filled 2018-08-21 (×8): qty 1

## 2018-08-21 NOTE — Clinical Social Work Maternal (Signed)
CLINICAL SOCIAL WORK MATERNAL/CHILD NOTE  Patient Details  Name: Joanne Huff MRN: [id number] Date of Birth: 2019-04-20  Date:  07-17-2018  Clinical Social Worker Initiating Note:  Hortencia Pilar, LCSWA  Date/Time: Initiated:  08/21/18/1015     Child's Name:    Joanne Huff    Biological Parents:    Joanne Huff (MOB) , Joanne Huff (FOB)   Need for Interpreter:  None   Reason for Referral:  Current Substance Use/Substance Use During Pregnancy    Address:  [address] [place] [address]    Phone number:  [phone] (home)     Additional phone number: none   Household Members/Support Persons (HM/SP):   Household Member/Support Person 3   HM/SP Name Relationship DOB or Age  HM/SP -1   Zatanna Bethel (FOB)   FOB   04/27/1975  HM/SP -2   Glean Salvo (Sibling)   Sibling   09/02/2014  HM/SP -3 Kani Maiello (MOB)  MOB 10/07/1989  HM/SP -4        HM/SP -5        HM/SP -6        HM/SP -7        HM/SP -8          Natural Supports (not living in the home):  Immediate Family, Extended Family   Professional Supports: None   Employment: Full-time(currently on [employer]. )   Type of Work: [employer]    Education:  High school graduate   Homebound arranged:  none   Surveyor, quantity Resources:  [employer]   Other Resources:  Sales executive , [employer]   Cultural/Religious Considerations Which May Impact Care:  none reported   Strengths:  Home prepared for child , Compliance with medical plan , Ability to meet basic needs    Psychotropic Medications:       None   Pediatrician:     [facility]   Pediatrician List:   [facility] [place]    [place]    [facility]      Pediatrician Fax Number:    Risk Factors/Current Problems:  Substance Use    Cognitive State:  Alert , Able to Concentrate , Goal Oriented , Insightful    Mood/Affect:  Calm , Comfortable , Bright     CSW Assessment: CSW consulted as MOB used THC during pregnancy. CSW wen to speak with MOB at bedside to discuss further concerns.  Upon entering the room. CSW Observed that MOB was holding infant under the sheet. CSW introduced role as well as congratulated MOB on the birth of infant. CSW explained CSW's role in the hospital and informed MOB the reason for the visit. CSW was advised that MOB she used THC about two weeks ago as a way to keep food down. MOB expressed that she was unable to eat and had been vomiting more in her third trimester of pregnancy. MOB expressed that she was very sorry for this and expressed she knew what would need to happen net. MOB expressed that she use to smoke THC and Cigarettes in her "younger years" however stopped once she confirmed that she was pregnant. MOB reported that she had an open CPS with [facility] DSS in 2016 as result of her [facility] use with her older son. CSW advised MOB of the hospital drug screen policy. CSW verbalized to MOB that UDS is not in at this time  however if it comes back positive for anything, CSW is obligated to make CPS report. MO expressed that using THC allowed her to sleep better and increase her ability to eat at that time. MOB expressed understanding of drug screen policy and presented no further questions at this time on drug screen policy. MOB denies having used any other substances and expressed being regretful hat she did so late in her pregnancy. CSW offered MOB support and understanding as she spoke.   Per RN there has been difficulty in colleting infants urine as infants diapers have been bowel movements per MOB. RN expressed that urine was still ordered to be collected at this time.   MOB reported that she lives at home with he fiance Stephen and her son Stephen Jr. MOB expressed that she works at [employer] and that she gets WIC and Food Stamps at this time. MOB informed CSW that she has no mental health diagnosis and that  that she hasn't felt SI or HI since given both. MOB expressed that she is not involved in a Domestic Violence situation either. MOB desires for infant to  be seen at [facility] on [address] for further care. CSW reviewed with MOB SIDS as well as PPD education. CSW provided MOB with PPD Checklist to monitor feeling for the next 6 weeks  Per MOB, infant will sleep in crib and a new carseat has been purchased for infant at this time. CSW has reached out to [place] DSS to confirm status on case at this time.   CSW Plan/Description:  CSW Will Continue to Monitor Umbilical Cord Tissue Drug Screen Results and Make Report if Warranted, Hospital Drug Screen Policy Information, Sudden Infant Death Syndrome (SIDS) Education    Onie Hayashi S Carin Shipp, LCSWA 08/21/2018, 10:39 AM  

## 2018-08-21 NOTE — Addendum Note (Signed)
Addendum  created 08/21/18 1354 by Elgie Congo, CRNA   Intraprocedure Event edited

## 2018-08-21 NOTE — Progress Notes (Signed)
CSW spoke with [facility] CPS and was advised that at this time MOB does not have any open cases. MOB's last worker was Domingo Sep in which case has been closed. CSW will continue to monitor UDS as well as CDS for further needs.    Claude Manges Sheryle Vice, MSW, LCSW-A [facility] [phone]

## 2018-08-21 NOTE — Progress Notes (Signed)
Subjective: Postpartum Day 1: Cesarean Delivery Patient reports incisional pain, tolerating PO and no problems voiding.    Objective: Vital signs in last 24 hours: Temp:  [97.7 F (36.5 C)-98.4 F (36.9 C)] 98.1 F (36.7 C) (05/06 0453) Pulse Rate:  [57-73] 60 (05/06 0453) Resp:  [16-20] 16 (05/06 0453) BP: (120-149)/(72-84) 120/76 (05/06 0453) SpO2:  [96 %-98 %] 98 % (05/06 0453)  Physical Exam:  General: alert, cooperative and no distress Lochia: appropriate Uterine Fundus: firm Incision: no significant drainage DVT Evaluation: No evidence of DVT seen on physical exam. Negative Homan's sign. No cords or calf tenderness.  Recent Labs    08/20/18 0730 08/21/18 0530  HGB 11.1* 9.8*  HCT 34.9* 30.3*    Assessment/Plan: Status post Cesarean section. Doing well postoperatively.  Continue current care.  Levie Heritage 08/21/2018, 7:37 AM

## 2018-08-22 MED ORDER — IBUPROFEN 600 MG PO TABS: 600.0000 mg | ORAL_TABLET | Freq: Four times a day (QID) | ORAL | 0 refills | Status: DC

## 2018-08-22 MED ORDER — OXYCODONE-ACETAMINOPHEN 5-325 MG PO TABS: 1.0000 | ORAL_TABLET | ORAL | 0 refills | Status: DC | PRN

## 2018-08-22 NOTE — Lactation Note (Addendum)
This note was copied from a baby's chart. Lactation Consultation Note  Patient Name: Joanne Huff [id number] Date: 08/22/2018   Baby 48 hours old.  9.2% weight loss.  Stools transitioning to green. Noted pacifier in room.  Pacifier use not recommended at this time. Provided education. Reviewed hand expression w/ good flow of colostrum. At 0900 baby received 35 ml of formula with slow flow nipple. Suggest breastfeeding before offering formula to help establish milk supply. Assisted w/ latching baby in cross cradle hold.  Baby latched briefly and fell asleep. Baby is eager and the breast and opens wide. Discussed feeding frequency, duration and waking techniques. Feed on demand approximately 8-12 times per day.   Reviewed engorgement care and monitoring voids/stools. Mother has manual pump and plans to get DEBP from [facility]. Suggest post pumping a few times per day and give volume back to baby to stabilize weight loss. If baby stays suggest mother call LC to assist w/ DEBP.       Maternal Data    Feeding Feeding Type: Formula Nipple Type: Slow - flow  LATCH Score                   Interventions    Lactation Tools Discussed/Used     Consult Status      Hardie Pulley 08/22/2018, 11:12 AM

## 2018-08-22 NOTE — Discharge Instructions (Signed)

## 2018-08-23 NOTE — Lactation Note (Signed)
This note was copied from a baby's chart. Lactation Consultation Note  Patient Name: Joanne Huff [id number] Date: 08/23/2018    Mom says she hears gulping when infant feeds. Mom says infant prefers doing laid-back nursing.   Mom had been pumping first, offering a bottle, and then offering breast. Mom then gives formula, if needed. I've encouraged Mom to offer breast first & then pump after. Mom had been pumping with size 27 flanges, but size 24 flanges are more appropriate for her nipple diameter. Sanitizing pump parts once/day was discussed.   Infant was fussy while I was in the room and didn't seem to be getting a deep latch. However, some breast softening was noted while infant was at bare breast. A nipple shield (size 24) was tried to see if infant could deepen/maintain latch longer, but no swallows were observed with nipple shield. (Mom had not utilized breast shells given earlier b/c she didn't wear a bra).   Mom's milk is coming to volume. PCOS. Mom has high intramammary folds  Lurline Hare [facility] 08/23/2018, 10:12 AM

## 2018-08-23 NOTE — Lactation Note (Signed)
This note was copied from a baby's chart. Lactation Consultation Note Baby 96 hrs old. Mom BF baby. Mom's milk starting to come in.  Breast full, not engorged. Encouraged mom to post pump after feeding.  Milk storage, breast massage, filling, engorgement, I&O, positioning, support discussed. Mom has short shaft nipples. Gave mom shells. Mom post-pump as LC leaving. Noted transitional milk in pump.  Mom has been supplementing w/formula. Encouraged mom to BF, massage breast, supplement w/BM after pumped.  Mom has WIC, has f/u appt. Has Lactation brochure, aware of support groups.  Patient Name: Joanne Huff Date: 08/23/2018 Reason for consult: Follow-up assessment   Maternal Data    Feeding Feeding Type: Breast Fed  LATCH Score Latch: Grasps breast easily, tongue down, lips flanged, rhythmical sucking.  Audible Swallowing: Spontaneous and intermittent  Type of Nipple: Everted at rest and after stimulation  Comfort (Breast/Nipple): Filling, red/small blisters or bruises, mild/mod discomfort  Hold (Positioning): No assistance needed to correctly position infant at breast.  LATCH Score: 9  Interventions Interventions: Breast feeding basics reviewed;DEBP;Shells;Hand express;Breast compression;Breast massage;Position options  Lactation Tools Discussed/Used Tools: Shells;Pump Shell Type: Inverted Breast pump type: Double-Electric Breast Pump   Consult Status Consult Status: Complete Date: 08/23/18    Charyl Dancer 08/23/2018, 4:44 AM

## 2020-04-24 ENCOUNTER — Other Ambulatory Visit: Payer: Medicaid Other

## 2020-04-24 ENCOUNTER — Other Ambulatory Visit: Payer: Self-pay

## 2020-04-24 DIAGNOSIS — Z20822 Contact with and (suspected) exposure to covid-19: Secondary | ICD-10-CM

## 2020-04-27 LAB — NOVEL CORONAVIRUS, NAA: SARS-CoV-2, NAA: NOT DETECTED

## 2020-04-28 ENCOUNTER — Other Ambulatory Visit: Payer: Medicaid Other

## 2021-10-21 ENCOUNTER — Ambulatory Visit (HOSPITAL_COMMUNITY)
Admission: EM | Admit: 2021-10-21 | Discharge: 2021-10-21 | Disposition: A | Discharge status: Home / Self Care | Payer: Medicaid Other | Attending: Family Medicine | Admitting: Family Medicine

## 2021-10-21 ENCOUNTER — Encounter (HOSPITAL_COMMUNITY): Payer: Self-pay

## 2021-10-21 ENCOUNTER — Ambulatory Visit (INDEPENDENT_AMBULATORY_CARE_PROVIDER_SITE_OTHER): Payer: Medicaid Other

## 2021-10-21 DIAGNOSIS — M25511 Pain in right shoulder: Secondary | ICD-10-CM

## 2021-10-21 MED ORDER — KETOROLAC TROMETHAMINE 30 MG/ML IJ SOLN
INTRAMUSCULAR | Status: AC
  Filled 2021-10-21: qty 1

## 2021-10-21 MED ORDER — IBUPROFEN 800 MG PO TABS: 800.0000 mg | ORAL_TABLET | Freq: Three times a day (TID) | ORAL | 0 refills | Status: DC | PRN

## 2021-10-21 MED ORDER — KETOROLAC TROMETHAMINE 30 MG/ML IJ SOLN
30.0000 mg | Freq: Once | INTRAMUSCULAR | Status: AC
  Administered 2021-10-21: 30 mg via INTRAMUSCULAR

## 2021-10-21 NOTE — Discharge Instructions (Addendum)
Your x-ray did not show any bony problem.  I do suspect some muscle problem with some rotator cuff problem.  You have been given a shot of Toradol 30 mg today.  Take ibuprofen 800 mg--1 tab every 8 hours as needed for pain.

## 2021-10-21 NOTE — ED Triage Notes (Signed)
For 2-3 days Patient having right shoulder pain. No known injuries, falls, no changes in activities or heavy lifting.   Patient states the shoulder pain is 8/10 pain, throbbing pain. Patient having reduced range of motion. Patient having swelling in the right shoulder.

## 2021-10-21 NOTE — ED Provider Notes (Signed)
[facility]    CSN: [id number] Arrival date & time: 10/21/21  0626      History   Chief Complaint Chief Complaint  Patient presents with   Shoulder Pain    HPI Joanne Huff is a 31 y.o. female.    Shoulder Pain  Here for right shoulder pain that began 3 days ago.  No trauma or injury.  It is mainly hurting her on the anterior aspect of that shoulder.  It hurts to Abduct the shoulder and to internally and externally rotate.  Last menstrual cycle was June 12.  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    Infection    UTI   Marijuana use    Ovarian cyst    Pregnancy induced hypertension    Smoker    Trichimoniasis    Vaginal Pap smear, abnormal     Patient Active Problem List   Diagnosis Date Noted   Status post primary low transverse cesarean section 08/20/2018   Breech presentation of fetus 08/08/2018   Failed external cephalic version 94/85/4627   Preeclampsia 09/01/2014    Past Surgical History:  Procedure Laterality Date   CESAREAN SECTION N/A 08/20/2018   Procedure: CESAREAN SECTION;  Surgeon: Levie Heritage, DO;  Location: [facility];  Service: Obstetrics;  Laterality: N/A;   NO PAST SURGERIES      OB History     Gravida  2   Para  2   Term  2   Preterm      AB      Living  2      SAB      IAB      Ectopic      Multiple  0   Live Births  2            Home Medications    Prior to Admission medications   Medication Sig Start Date End Date Taking? Authorizing Provider  ibuprofen (ADVIL) 800 MG tablet Take 1 tablet (800 mg total) by mouth every 8 (eight) hours as needed (pain). 10/21/21  Yes Zenia Resides, MD    Family History Family History  Problem Relation Age of Onset   Hypertension Mother    Stroke Mother    Arthritis Father    Hypertension Father    Hypertension Maternal  Grandmother    Diabetes Maternal Grandmother    Diabetes Paternal Grandmother     Social History Social History   Tobacco Use   Smoking status: Every Day    Packs/day: 0.50    Years: 13.00    Total pack years: 6.50    Types: Cigarettes   Smokeless tobacco: Never  Vaping Use   Vaping Use: Never used  Substance Use Topics   Alcohol use: No   Drug use: Not Currently    Types: Marijuana    Comment: Last marijuana 2 weeks before 4/29     Allergies   Patient has no known allergies.   Review of Systems Review of Systems   Physical Exam Triage Vital Signs ED Triage Vitals  Enc Vitals Group     BP 10/21/21 1053 113/74     Pulse Rate 10/21/21 1053 62     Resp 10/21/21 1053 16     Temp 10/21/21 1053 98 F (36.7 C)  Temp Source 10/21/21 1053 Oral     SpO2 10/21/21 1053 98 %     Weight 10/21/21 1056 272 lb 0.8 oz (123.4 kg)     Height 10/21/21 1056 5\' 9"  (1.753 m)     Head Circumference --      Peak Flow --      Pain Score 10/21/21 1056 8     Pain Loc --      Pain Edu? --      Excl. in GC? --    No data found.  Updated Vital Signs BP 113/74 (BP Location: Left Arm)   Pulse 62   Temp 98 F (36.7 C) (Oral)   Resp 16   Ht 5\' 9"  (1.753 m)   Wt 123.4 kg   LMP 09/26/2021 (Exact Date)   SpO2 98%   Breastfeeding No   BMI 40.17 kg/m   Visual Acuity Right Eye Distance:   Left Eye Distance:   Bilateral Distance:    Right Eye Near:   Left Eye Near:    Bilateral Near:     Physical Exam Vitals reviewed.  Constitutional:      General: She is not in acute distress.    Appearance: She is not toxic-appearing.  Cardiovascular:     Rate and Rhythm: Normal rate and regular rhythm.  Musculoskeletal:        General: Tenderness (right anterior shoulder.) present.  Skin:    Coloration: Skin is not pale.     Findings: No rash.  Neurological:     Mental Status: She is alert.      UC Treatments / Results  Labs (all labs ordered are listed, but only abnormal  results are displayed) Labs Reviewed - No data to display  EKG   Radiology DG Shoulder Right  Result Date: 10/21/2021 CLINICAL DATA:  RIGHT anterior shoulder pain for 3 days. No known injury. EXAM: RIGHT SHOULDER - 2+ VIEW COMPARISON:  None Available. FINDINGS: There is no evidence of fracture or dislocation. There is no evidence of arthropathy or other focal bone abnormality. Soft tissues are unremarkable. IMPRESSION: Negative. Electronically Signed   By: 11/26/2021 M.D.   On: 10/21/2021 11:51    Procedures Procedures (including critical care time)  Medications Ordered in UC Medications  ketorolac (TORADOL) 30 MG/ML injection 30 mg (has no administration in time range)    Initial Impression / Assessment and Plan / UC Course  I have reviewed the triage vital signs and the nursing notes.  Pertinent labs & imaging results that were available during my care of the patient were reviewed by me and considered in my medical decision making (see chart for details).     X-ray shows no bony problem.  Will provide pain relief and she is given contact info for orthopedics Final Clinical Impressions(s) / UC Diagnoses   Final diagnoses:  Acute pain of right shoulder     Discharge Instructions      Your x-ray did not show any bony problem.  I do suspect some muscle problem with some rotator cuff problem.  You have been given a shot of Toradol 30 mg today.  Take ibuprofen 800 mg--1 tab every 8 hours as needed for pain.       ED Prescriptions     Medication Sig Dispense Auth. Provider   ibuprofen (ADVIL) 800 MG tablet Take 1 tablet (800 mg total) by mouth every 8 (eight) hours as needed (pain). 30 tablet Norva Pavlov, 12/22/2021, MD  I have reviewed the PDMP during this encounter.   Barrett Henle, MD 10/21/21 [phone]

## 2022-02-01 ENCOUNTER — Inpatient Hospital Stay (HOSPITAL_COMMUNITY)
Admission: AD | Admit: 2022-02-01 | Discharge: 2022-02-01 | Disposition: A | Discharge status: Home / Self Care | Payer: Medicaid Other | Attending: Obstetrics & Gynecology | Admitting: Obstetrics & Gynecology

## 2022-02-01 ENCOUNTER — Inpatient Hospital Stay (HOSPITAL_COMMUNITY): Payer: Medicaid Other

## 2022-02-01 ENCOUNTER — Encounter (HOSPITAL_COMMUNITY): Payer: Self-pay | Admitting: *Deleted

## 2022-02-01 DIAGNOSIS — O209 Hemorrhage in early pregnancy, unspecified: Secondary | ICD-10-CM | POA: Insufficient documentation

## 2022-02-01 DIAGNOSIS — O3680X0 Pregnancy with inconclusive fetal viability, not applicable or unspecified: Secondary | ICD-10-CM | POA: Insufficient documentation

## 2022-02-01 DIAGNOSIS — Z3A01 Less than 8 weeks gestation of pregnancy: Secondary | ICD-10-CM | POA: Diagnosis not present

## 2022-02-01 DIAGNOSIS — N939 Abnormal uterine and vaginal bleeding, unspecified: Secondary | ICD-10-CM

## 2022-02-01 LAB — URINALYSIS, ROUTINE W REFLEX MICROSCOPIC
Bilirubin Urine: NEGATIVE
Glucose, UA: NEGATIVE mg/dL
Hgb urine dipstick: NEGATIVE
Ketones, ur: NEGATIVE mg/dL
Nitrite: NEGATIVE
Protein, ur: NEGATIVE mg/dL
Specific Gravity, Urine: 1.025 (ref 1.005–1.030)
pH: 6 (ref 5.0–8.0)

## 2022-02-01 LAB — HCG, QUANTITATIVE, PREGNANCY: hCG, Beta Chain, Quant, S: 339 m[IU]/mL — ABNORMAL HIGH (ref <5)

## 2022-02-01 LAB — CBC
HCT: 38.7 % (ref 36.0–46.0)
Hemoglobin: 12.4 g/dL (ref 12.0–15.0)
MCH: 28.1 pg (ref 26.0–34.0)
MCHC: 32 g/dL (ref 30.0–36.0)
MCV: 87.8 fL (ref 80.0–100.0)
Platelets: 342 10*3/uL (ref 150–400)
RBC: 4.41 MIL/uL (ref 3.87–5.11)
RDW: 13.8 % (ref 11.5–15.5)
WBC: 6.6 10*3/uL (ref 4.0–10.5)
nRBC: 0 % (ref 0.0–0.2)

## 2022-02-01 LAB — WET PREP, GENITAL
Sperm: NONE SEEN
Trich, Wet Prep: NONE SEEN
WBC, Wet Prep HPF POC: >=10 — AB (ref <10)
Yeast Wet Prep HPF POC: NONE SEEN

## 2022-02-01 LAB — POCT PREGNANCY, URINE: Preg Test, Ur: POSITIVE — AB

## 2022-02-01 NOTE — Discharge Instructions (Signed)
Safe Medications in Pregnancy    Acne: Benzoyl Peroxide Salicylic Acid  Backache/Headache: Tylenol: 2 regular strength every 4 hours OR              2 Extra strength every 6 hours  Colds/Coughs/Allergies: Benadryl (alcohol free) 25 mg every 6 hours as needed Breath right strips Claritin Cepacol throat lozenges Chloraseptic throat spray Cold-Eeze- up to three times per day Cough drops, alcohol free Flonase (by prescription only) Guaifenesin Mucinex Robitussin DM (plain only, alcohol free) Saline nasal spray/drops Sudafed (pseudoephedrine) & Actifed ** use only after [redacted] weeks gestation and if you do not have high blood pressure Tylenol Vicks Vaporub Zinc lozenges Zyrtec   Constipation: Colace Ducolax suppositories Fleet enema Glycerin suppositories Metamucil Milk of magnesia Miralax Senokot Smooth move tea  Diarrhea: Kaopectate Imodium A-D  *NO pepto Bismol  Hemorrhoids: Anusol Anusol HC Preparation H Tucks  Indigestion: Tums Maalox Mylanta Zantac  Pepcid  Insomnia: Benadryl (alcohol free) 25mg every 6 hours as needed Tylenol PM Unisom, no Gelcaps  Leg Cramps: Tums MagGel  Nausea/Vomiting:  Bonine Dramamine Emetrol Ginger extract Sea bands Meclizine  Nausea medication to take during pregnancy:  Unisom (doxylamine succinate 25 mg tablets) Take one tablet daily at bedtime. If symptoms are not adequately controlled, the dose can be increased to a maximum recommended dose of two tablets daily (1/2 tablet in the morning, 1/2 tablet mid-afternoon and one at bedtime). Vitamin B6 100mg tablets. Take one tablet twice a day (up to 200 mg per day).  Skin Rashes: Aveeno products Benadryl cream or 25mg every 6 hours as needed Calamine Lotion 1% cortisone cream  Yeast infection: Gyne-lotrimin 7 Monistat 7   **If taking multiple medications, please check labels to avoid duplicating the same active ingredients **take  medication as directed on the label ** Do not exceed 4000 mg of tylenol in 24 hours **Do not take medications that contain aspirin or ibuprofen   Prenatal Care Providers           [facility]  [address] [phone]  [facility]   [address]  [phone]  [facility] @ [place]       [address] [phone]            [facility] @ [place]     [address] [phone]          [facility] @ [place]   [address] [phone]  [facility]  [address] [phone]     [facility] @ [facility])  [address]   [phone]     [facility]  Phone: [phone]  [facility]  Phone: [phone]  [facility] Phone: [phone]  [facility] Phone: [phone]  [facility] Phone: [phone]  [facility] Phone: [phone]  [facility]  Phone: [phone]  

## 2022-02-01 NOTE — MAU Provider Note (Signed)
History     CSN: [id number]  Arrival date and time: 02/01/22 1120   Event Date/Time   First Provider Initiated Contact with Patient 02/01/22 1156      Chief Complaint  Patient presents with   Vaginal Bleeding   Possible Pregnancy   HPI  Joanne Huff is a 31 y.o. G3P2002 at [redacted]w[redacted]d who presents for evaluation of spotting when she wipes. Patient reports she had a positive pregnancy test on Sunday and started seeing spotting. She denies any pain.   She denies any discharge. Denies any constipation, diarrhea or any urinary complaints.   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    Infection    UTI   Marijuana use    Ovarian cyst    Pregnancy induced hypertension    Smoker    Trichimoniasis    Vaginal Pap smear, abnormal     Past Surgical History:  Procedure Laterality Date   CESAREAN SECTION N/A 08/20/2018   Procedure: CESAREAN SECTION;  Surgeon: Levie Heritage, DO;  Location: [facility];  Service: Obstetrics;  Laterality: N/A;   MOUTH SURGERY     2 teeth extracted    Family History  Problem Relation Age of Onset   Cancer Mother    Hypertension Mother    Stroke Mother    Arthritis Father    Hypertension Father    Hypertension Maternal Grandmother    Diabetes Maternal Grandmother    Diabetes Paternal Grandmother     Social History   Tobacco Use   Smoking status: Former    Packs/day: 0.50    Years: 13.00    Total pack years: 6.50    Types: Cigarettes   Smokeless tobacco: Never   Tobacco comments:    Quit with preg  Vaping Use   Vaping Use: Never used  Substance Use Topics   Alcohol use: Not Currently    Comment: rare/social, none now   Drug use: Not Currently     Types: Marijuana    Comment: none 2022    Allergies: No Known Allergies  No medications prior to admission.    Review of Systems  Constitutional: Negative.  Negative for fatigue and fever.  HENT: Negative.    Respiratory: Negative.  Negative for shortness of breath.   Cardiovascular: Negative.  Negative for chest pain.  Gastrointestinal: Negative.  Negative for abdominal pain, constipation, diarrhea, nausea and vomiting.  Genitourinary:  Positive for vaginal bleeding. Negative for dysuria and vaginal discharge.  Neurological: Negative.  Negative for dizziness and headaches.   Physical Exam   Blood pressure 132/76, pulse 77, temperature 98.5 F (36.9 C), temperature source Oral, resp. rate 18, height 5\' 8"  (1.727 m), weight 116.2 kg, last menstrual period 12/23/2021, SpO2 100 %.  Patient Vitals for the past 24 hrs:  BP Temp Temp src Pulse Resp SpO2 Height Weight  02/01/22 1135 132/76 98.5  F (36.9 C) Oral 77 18 100 % 5\' 8"  (1.727 m) 116.2 kg    Physical Exam Vitals and nursing note reviewed.  Constitutional:      General: She is not in acute distress.    Appearance: She is well-developed.  HENT:     Head: Normocephalic.  Eyes:     Pupils: Pupils are equal, round, and reactive to light.  Cardiovascular:     Rate and Rhythm: Normal rate and regular rhythm.     Heart sounds: Normal heart sounds.  Pulmonary:     Effort: Pulmonary effort is normal. No respiratory distress.     Breath sounds: Normal breath sounds.  Abdominal:     General: Bowel sounds are normal. There is no distension.     Palpations: Abdomen is soft.     Tenderness: There is no abdominal tenderness.  Skin:    General: Skin is warm and dry.  Neurological:     Mental Status: She is alert and oriented to person, place, and time.  Psychiatric:        Mood and Affect: Mood normal.        Behavior: Behavior normal.        Thought Content: Thought content normal.        Judgment: Judgment normal.       MAU Course  Procedures  Results for orders placed or performed during the hospital encounter of 02/01/22 (from the past 24 hour(s))  Pregnancy, urine POC     Status: Abnormal   Collection Time: 02/01/22 11:50 AM  Result Value Ref Range   Preg Test, Ur POSITIVE (A) NEGATIVE  Urinalysis, Routine w reflex microscopic Urine, Clean Catch     Status: Abnormal   Collection Time: 02/01/22 11:56 AM  Result Value Ref Range   Color, Urine AMBER (A) YELLOW   APPearance CLOUDY (A) CLEAR   Specific Gravity, Urine 1.025 1.005 - 1.030   pH 6.0 5.0 - 8.0   Glucose, UA NEGATIVE NEGATIVE mg/dL   Hgb urine dipstick NEGATIVE NEGATIVE   Bilirubin Urine NEGATIVE NEGATIVE   Ketones, ur NEGATIVE NEGATIVE mg/dL   Protein, ur NEGATIVE NEGATIVE mg/dL   Nitrite NEGATIVE NEGATIVE   Leukocytes,Ua SMALL (A) NEGATIVE   RBC / HPF 0-5 0 - 5 RBC/hpf   WBC, UA 11-20 0 - 5 WBC/hpf   Bacteria, UA MANY (A) NONE SEEN   Squamous Epithelial / LPF 21-50 0 - 5   Mucus PRESENT   Wet prep, genital     Status: Abnormal   Collection Time: 02/01/22 12:00 PM   Specimen: Vaginal  Result Value Ref Range   Yeast Wet Prep HPF POC NONE SEEN NONE SEEN   Trich, Wet Prep NONE SEEN NONE SEEN   Clue Cells Wet Prep HPF POC PRESENT (A) NONE SEEN   WBC, Wet Prep HPF POC >=10 (A) <10   Sperm NONE SEEN   hCG, quantitative, pregnancy     Status: Abnormal   Collection Time: 02/01/22 12:47 PM  Result Value Ref Range   hCG, Beta Chain, Quant, S 339 (H) <5 mIU/mL  CBC     Status: None   Collection Time: 02/01/22  1:15 PM  Result Value Ref Range   WBC 6.6 4.0 - 10.5 K/uL   RBC 4.41 3.87 - 5.11 MIL/uL   Hemoglobin 12.4 12.0 - 15.0 g/dL   HCT 38.7 36.0 - 46.0 %   MCV 87.8 80.0 - 100.0 fL   MCH 28.1 26.0 - 34.0 pg  MCHC 32.0 30.0 - 36.0 g/dL   RDW 38.1 01.7 - 51.0 %   Platelets 342 150 - 400 K/uL   nRBC 0.0 0.0 - 0.2 %     US OB LESS THAN 14 WEEKS WITH OB TRANSVAGINAL  Result Date: 02/01/2022 CLINICAL DATA:  Spotting  today EXAM: OBSTETRIC <14 WK [place] AND TRANSVAGINAL OB US TECHNIQUE: Both transabdominal and transvaginal ultrasound examinations were performed for complete evaluation of the gestation as well as the maternal uterus, adnexal regions, and pelvic cul-de-sac. Transvaginal technique was performed to assess early pregnancy. COMPARISON:  Ultrasound 12/31/2017 FINDINGS: Intrauterine gestational sac: None Yolk sac:  Not Visualized. Embryo:  Not Visualized. Cardiac Activity: Not Visualized. Heart Rate: Not applicable MSD: Not applicable CRL:  Not applicable Subchorionic hemorrhage:  Not applicable. Maternal uterus/adnexae: Normal ovaries. Endometrium measures 14 mm thick. Trace, nonspecific free fluid in the pelvis. IMPRESSION: No intrauterine gestational sac visualized. Trace, nonspecific free fluid in the pelvis. Recommend trending beta hCG with OBGYN follow-up and repeat ultrasound as clinically indicated. Electronically Signed   By: Caprice Renshaw M.D.   On: 02/01/2022 12:31     MDM Labs ordered and reviewed.   UA, UPT CBC, HCG ABO/Rh- A Pos Wet prep and gc/chlamydia US OB Comp Less 14 weeks with Transvaginal  Discussed with client the diagnosis of pregnancy of unknown anatomic location.  Three possibilities of outcome are: a healthy pregnancy that is too early to see a yolk sac to confirm the pregnancy is in the uterus, a pregnancy that is not healthy and has not developed and will not develop, and an ectopic pregnancy that is in the abdomen that cannot be identified at this time.  And ectopic pregnancy can be a life threatening situation as a pregnancy needs to be in the uterus which is a muscle and can stretch to accommodate the growth of a pregnancy.  Other structures in the pelvis and abdomen as not muscular and do not stretch with the growth of a pregnancy.  Worst case scenario is that a structure ruptures with a growing pregnancy not in the uterus and and internal hemorrhage can be a life threatening  situation.  We need to follow the progression of this pregnancy carefully.  We need to check another serum pregnancy hormone level to determine if the levels are rising appropriately  and to determine the next steps that are needed for you. Patient's questions were answered.  Appointment made for patient to return to [facility] on Friday for repeat HCG  Assessment and Plan   1. Pregnancy of unknown anatomic location   2. [redacted] weeks gestation of pregnancy   3. Vaginal spotting    -Discharge home in stable condition -Strict ectopic precautions discussed -Patient advised to follow-up with [facility] on Friday for repeat labs -Patient may return to MAU as needed or if her condition were to change or worsen  Rolm Bookbinder, CNM 02/01/2022, 11:56 AM

## 2022-02-01 NOTE — MAU Note (Signed)
Joanne Huff is a 31 y.o. at Unknown here in MAU reporting: +HPT on Sunday.  Started spotting this morning, light pink, no clots.  No pain. Denies recent intercourse LMP: 9/08 Onset of complaint: this morning Pain score: none Vitals:   02/01/22 1135  BP: 132/76  Pulse: 77  Resp: 18  Temp: 98.5 F (36.9 C)  SpO2: 100%      Lab orders placed from triage:  upt/US if positive

## 2022-02-02 LAB — GC/CHLAMYDIA PROBE AMP (~~LOC~~) NOT AT ARMC
Chlamydia: NEGATIVE
Comment: NEGATIVE
Comment: NORMAL
Neisseria Gonorrhea: NEGATIVE

## 2022-02-03 ENCOUNTER — Ambulatory Visit (INDEPENDENT_AMBULATORY_CARE_PROVIDER_SITE_OTHER): Payer: Medicaid Other | Admitting: *Deleted

## 2022-02-03 ENCOUNTER — Other Ambulatory Visit: Payer: Self-pay

## 2022-02-03 VITALS — BP 116/64 | HR 66 | Ht 68.0 in | Wt 256.2 lb

## 2022-02-03 DIAGNOSIS — O3680X0 Pregnancy with inconclusive fetal viability, not applicable or unspecified: Secondary | ICD-10-CM

## 2022-02-03 LAB — BETA HCG QUANT (REF LAB): hCG Quant: 606 m[IU]/mL

## 2022-02-03 NOTE — Progress Notes (Signed)
Pt presents for stat BHCG. She reports no bleeding and no pain.  Pt was advised she will be called with results today and next steps in care. She stated that she does not have a phone @ present and requested that we call her mother as she will be there. Pt stated that if the phone is not answered, a detailed message may be left on voicemail. She should return to MAU of she develops heavy vaginal bleeding or severe abdominal pain.  She voiced understanding of all instructions and information given.   1145  BHCG result (606) reviewed by Dr. Kennon Rounds and found to be increasing however not quite doubled since 10/18.  She recommends pt to have repeat stat BHCG @ [place] on 10/22 to insure continued appropriate rise. Pt was contacted and provided with results as well as plan of care recommendation. She voiced understanding and agreed to have repeat BHCG on 10/22 @ [place].

## 2022-02-05 ENCOUNTER — Inpatient Hospital Stay (HOSPITAL_COMMUNITY): Payer: Medicaid Other

## 2022-02-05 ENCOUNTER — Inpatient Hospital Stay (HOSPITAL_COMMUNITY)
Admission: AD | Admit: 2022-02-05 | Discharge: 2022-02-05 | Disposition: A | Discharge status: Home / Self Care | Payer: Medicaid Other | Attending: Family Medicine | Admitting: Family Medicine

## 2022-02-05 DIAGNOSIS — O26891 Other specified pregnancy related conditions, first trimester: Secondary | ICD-10-CM | POA: Insufficient documentation

## 2022-02-05 DIAGNOSIS — Z3490 Encounter for supervision of normal pregnancy, unspecified, unspecified trimester: Secondary | ICD-10-CM

## 2022-02-05 DIAGNOSIS — Z3A01 Less than 8 weeks gestation of pregnancy: Secondary | ICD-10-CM | POA: Diagnosis not present

## 2022-02-05 LAB — HCG, QUANTITATIVE, PREGNANCY: hCG, Beta Chain, Quant, S: 1664 m[IU]/mL — ABNORMAL HIGH (ref <5)

## 2022-02-05 NOTE — MAU Provider Note (Signed)
History     CSN: [id number]  Arrival date and time: 02/05/22 6010   None     Chief Complaint  Patient presents with   Follow-up   Joanne Huff is a 31 y.o. G3P2002 at [redacted]w[redacted]d who presents for bHCG follow up. She is having mild vaginal cramping but otherwise denies vaginal bleeding. She is unsure of her LMP stating she has a history of irregular periods.   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    Infection    UTI   Marijuana use    Ovarian cyst    Pregnancy induced hypertension    Smoker    Trichimoniasis    Vaginal Pap smear, abnormal     Past Surgical History:  Procedure Laterality Date   CESAREAN SECTION N/A 08/20/2018   Procedure: CESAREAN SECTION;  Surgeon: Truett Mainland, DO;  Location: [facility];  Service: Obstetrics;  Laterality: N/A;   MOUTH SURGERY     2 teeth extracted    Family History  Problem Relation Age of Onset   Cancer Mother    Hypertension Mother    Stroke Mother    Arthritis Father    Hypertension Father    Hypertension Maternal Grandmother    Diabetes Maternal Grandmother    Diabetes Paternal Grandmother     Social History   Tobacco Use   Smoking status: Former    Packs/day: 0.50    Years: 13.00    Total pack years: 6.50    Types: Cigarettes   Smokeless tobacco: Never   Tobacco comments:    Quit with preg  Vaping Use   Vaping Use: Never used  Substance Use Topics   Alcohol use: Not Currently    Comment: rare/social, none now   Drug use: Not Currently    Types: Marijuana    Comment: none 2022    Allergies: No Known Allergies  Medications Prior to Admission  Medication Sig Dispense Refill Last Dose   valACYclovir (VALTREX) 500 MG  tablet Take 500 mg by mouth 2 (two) times daily as needed. (Patient not taking: Reported on 02/03/2022)       Review of Systems  Constitutional:  Negative for appetite change and fever.  Eyes:  Negative for visual disturbance.  Respiratory:  Negative for cough and shortness of breath.   Cardiovascular:  Negative for chest pain.  Gastrointestinal:  Negative for abdominal pain.  Genitourinary:  Positive for vaginal pain. Negative for difficulty urinating, dysuria, frequency, pelvic pain and vaginal bleeding.  Neurological:  Negative for headaches.   Physical Exam   Blood pressure 125/62, pulse 75, temperature 97.6 F (36.4 C), resp. rate 18, last menstrual period 12/23/2021.  Physical Exam Vitals reviewed.  Constitutional:      General: She is not in acute distress.    Appearance: She is not ill-appearing, toxic-appearing or diaphoretic.  Eyes:  Conjunctiva/sclera: Conjunctivae normal.  Cardiovascular:     Rate and Rhythm: Normal rate and regular rhythm.  Pulmonary:     Effort: Pulmonary effort is normal.     Breath sounds: Normal breath sounds.  Abdominal:     Palpations: Abdomen is soft.     Tenderness: There is no abdominal tenderness. There is no right CVA tenderness, left CVA tenderness, guarding or rebound.  Musculoskeletal:     Right lower leg: No edema.     Left lower leg: No edema.  Neurological:     General: No focal deficit present.     Mental Status: She is alert and oriented to person, place, and time.  Psychiatric:        Mood and Affect: Mood normal.        Behavior: Behavior normal.     MAU Course  Procedures  MDM Reviewed prior beta HCGs and [place] from 02/01/2022.   Results for orders placed or performed during the hospital encounter of 02/05/22 (from the past 24 hour(s))  hCG, quantitative, pregnancy     Status: Abnormal   Collection Time: 02/05/22  8:44 AM  Result Value Ref Range   hCG, Beta Chain, Quant, S 1,664 (H) <5 mIU/mL   OBSTETRIC <14  WK [place] AND TRANSVAGINAL OB US   TECHNIQUE: Both transabdominal and transvaginal ultrasound examinations were performed for complete evaluation of the gestation as well as the maternal uterus, adnexal regions, and pelvic cul-de-sac. Transvaginal technique was performed to assess early pregnancy.   COMPARISON:  02/01/2022   FINDINGS: Intrauterine gestational sac: Single   Yolk sac:  Not Visualized.   Embryo:  Not Visualized.   MSD: 6 mm   5 w   0 d   Subchorionic hemorrhage:  None visualized.   Maternal uterus/adnexae: Both ovaries are normal in appearance. No adnexal mass or abnormal free fluid identified.   IMPRESSION: Single intrauterine gestational sac measuring 5 weeks 0 days by mean sac diameter. Consider followup ultrasound to confirm viability in 10-14 days.     Electronically Signed   By: Danae Orleans M.D.   On: 02/05/2022 13:17  Assessment and Plan   1. Intrauterine pregnancy BetaHCG rise appropriate. Discussed US findings and need to follow up with [place] in 10-14 days for viability purposes. The patient voiced understanding.  -Continue PNV -US OB Transvaginal; Future -Return precautions given  2. [redacted] weeks gestation of pregnancy   Joanne Huff 02/05/2022, 9:35 AM

## 2022-02-05 NOTE — MAU Note (Signed)
Pt here for f/u BHCG. Had second level at clinic told to come in today for one more BHCG. Reports small amount of cramping no vag bleeding .

## 2022-02-16 ENCOUNTER — Ambulatory Visit
Admission: RE | Admit: 2022-02-16 | Discharge: 2022-02-16 | Disposition: A | Discharge status: Home / Self Care | Payer: Medicaid Other | Source: Ambulatory Visit | Attending: Family Medicine | Admitting: Family Medicine

## 2022-02-16 ENCOUNTER — Ambulatory Visit (INDEPENDENT_AMBULATORY_CARE_PROVIDER_SITE_OTHER): Payer: Medicaid Other

## 2022-02-16 DIAGNOSIS — Z712 Person consulting for explanation of examination or test findings: Secondary | ICD-10-CM

## 2022-02-16 DIAGNOSIS — O208 Other hemorrhage in early pregnancy: Secondary | ICD-10-CM | POA: Insufficient documentation

## 2022-02-16 DIAGNOSIS — Z3A01 Less than 8 weeks gestation of pregnancy: Secondary | ICD-10-CM | POA: Diagnosis present

## 2022-02-16 NOTE — Progress Notes (Signed)
Ultrasound Results Visit   Pt here today following viability [place] for results. Results reviewed with U. Anyanwu, MD  who finds small subchorionic hemorrhage and reccomends pt to begin taking prenatal vitamin and report to MAU with any vaginal bleeding.  Reviewed dating and provider recommendation with patient:   EDD:  6w 1d today  Reviewed medications and allergies with patient; list of medications safe to take during pregnancy given. Recommended pt begin prenatal vitamin and schedule prenatal care.

## 2022-03-16 NOTE — Progress Notes (Signed)
Called pt @ 0915 and left message that I am calling in regards to her intake appt.  Please give the office a call back. I will call back in 15 minutes in which if I do not reach you I will have to request that you reschedule intake and provider appt.   Called pt @ 0935 and left message that this is second attempt if she could please reschedule her intake and provider appt.    Leonette Nutting  03/16/22 This encounter was created in error - please disregard.

## 2022-03-29 ENCOUNTER — Encounter: Payer: Self-pay | Admitting: Certified Nurse Midwife

## 2022-04-13 ENCOUNTER — Ambulatory Visit (INDEPENDENT_AMBULATORY_CARE_PROVIDER_SITE_OTHER): Payer: Medicaid Other

## 2022-04-13 ENCOUNTER — Other Ambulatory Visit (HOSPITAL_COMMUNITY)
Admission: RE | Admit: 2022-04-13 | Discharge: 2022-04-13 | Disposition: A | Discharge status: Home / Self Care | Payer: Medicaid Other | Source: Ambulatory Visit | Attending: Family Medicine | Admitting: Family Medicine

## 2022-04-13 VITALS — BP 116/74 | HR 86 | Ht 70.0 in | Wt 266.2 lb

## 2022-04-13 DIAGNOSIS — Z3A00 Weeks of gestation of pregnancy not specified: Secondary | ICD-10-CM

## 2022-04-13 DIAGNOSIS — O0990 Supervision of high risk pregnancy, unspecified, unspecified trimester: Secondary | ICD-10-CM | POA: Diagnosis present

## 2022-04-13 MED ORDER — BLOOD PRESSURE KIT DEVI: 1.0000 | 0 refills | Status: DC | PRN

## 2022-04-13 MED ORDER — GOJJI WEIGHT SCALE MISC: 1.0000 | 0 refills | Status: DC | PRN

## 2022-04-13 NOTE — Progress Notes (Signed)
New OB Intake  Patient came in person for new ob intake.   I discussed the limitations, risks, security and privacy concerns of performing an evaluation and management service by telephone and the availability of in person appointments. I also discussed with the patient that there may be a patient responsible charge related to this service. The patient expressed understanding and agreed to proceed.  I explained I am completing New OB Intake today. We discussed EDD of 10/11/2022 that is based on ultrasound of 02/16/2022. Pt is G3/P2. I reviewed her allergies, medications, Medical/Surgical/OB history, and appropriate screenings. I informed her of [facility] services. [facility] information placed in AVS. Based on history, this is a high risk pregnancy.  Patient Active Problem List   Diagnosis Date Noted   Supervision of high risk pregnancy, antepartum 04/13/2022   Hx of cesarean section 08/20/2018   Hx of Preeclampsia 09/01/2014   HSV-2 infection complicating pregnancy      Concerns addressed today Hx of C-Section due to breech. Patient would like another C-Section. -Hx of Pre-Eclampsia with previous two pregnancies.   Delivery Plans Plans to deliver at [facility]. Patient given information for [facility] website for more information about [facility]. Patient is not interested in water birth. Offered upcoming OB visit with CNM to discuss further.  MyChart/Babyscripts MyChart access verified. I explained pt will have some visits in office and some virtually. Babyscripts instructions given and order placed. Patient verifies receipt of registration text/e-mail. Account successfully created and app downloaded.  Blood Pressure Cuff/Weight Scale Blood pressure cuff ordered for patient to pick-up from [employer]. Explained after first prenatal appt pt will check weekly and document in Babyscripts. Patient does not have weight scale; order sent to [facility], patient may track  weight weekly in Babyscripts.  Anatomy US Explained first scheduled [place] will be around 19 weeks. Anatomy US scheduled for 05/17/2022 at 09:45 AM. Pt notified to arrive at 09:30 AM.  Labs -Initial OB labs, CMP, Protein Creatine, Hemoglobin A1C, Genetic screening, OB Culture, GC/CH were collected. Will need pap smear and AFP next visit.   COVID Vaccine Patient has not had COVID vaccine.   Is patient a CenteringPregnancy candidate?  Declined Declined due to Childcare    Is patient a Mom+Baby Combined Care candidate?  Not a candidate   If accepted, Mom+Baby staff notified  Social Determinants of Health Food Insecurity: Patient denies food insecurity. WIC Referral: Patient is interested in referral to [facility].  Transportation: Patient denies transportation needs. Childcare: Discussed no children allowed at ultrasound appointments. Offered childcare services; patient declines childcare services at this time.  First visit review I reviewed new OB appt with patient. I explained they will have a provider visit that includes pap smear and AFP. Explained pt will be seen by Lavonda Jumbo, DO at first visit; encounter routed to appropriate provider. Explained that patient will be seen by pregnancy navigator following visit with provider.   Vidal Schwalbe, [place] 04/13/2022  1:23 PM

## 2022-04-14 LAB — GC/CHLAMYDIA PROBE AMP (~~LOC~~) NOT AT ARMC
Chlamydia: NEGATIVE
Comment: NEGATIVE
Comment: NORMAL
Neisseria Gonorrhea: NEGATIVE

## 2022-04-15 LAB — CULTURE, OB URINE

## 2022-04-15 LAB — URINE CULTURE, OB REFLEX

## 2022-04-17 NOTE — L&D Delivery Note (Signed)
    Joanne Huff is a 32 y.o. [id number] s/p VBAC at [redacted]w[redacted]d. She was admitted for IOL for A1DM, gHTN, and NR Antenatal Testing.   ROM: 7h 9m with clear fluid GBS Status: Negative Maximum Maternal Temperature: 99.5  Labor Progress: Patient started on pitocin and once to 11mUn/min, foley bulb placed via speculum.  Pitocin decreased to allow for ripening.  Foley bulb spontaneously expelled and pitocin titration continued. At 5 cm AROM performed.  Patient then progressed to complete dilation and delivered below with staff and family support.   Delivery Date/Time: Wednesday September 20, 2022 at 1354 Delivery: Called to room and patient was complete and pushing. Head delivered in ROA with restitution to ROT. No nuchal cord present around neck, but after delivery of shoulders and body cord noted around wrist area. Cord reduced, by provider, while drying and stimulating infant.  Infant then placed on mother's abdomen, where nurses continued to dry and stimulate. Cord clamped x 2 after 3-minute delay, and cut by FOB. Cord blood drawn. Placenta delivered spontaneously via Tomasa Blase and was noted to be intact with 3VC upon inspection.  Vaginal inspection revealed a 2nd degree perineal laceration that was repaired with 3-0 vicryl on CT-1.  No additional anesthetic necessary and patient tolerated the procedure well. Fundus firm, at the umbilicus, but bleeding moderate.  Uterine sweep reveals of clots with improvement of bleeding.  Patient given of buccal cytotec.  Mother hemodynamically stable and infant skin to skin prior to provider exit.  Mother desires BTL for birth control and opts to breastfeed.    Placenta: Disposal Complications: None Lacerations: 2nd Degree Perineal EBL: 322 Analgesia: Epidural  Postpartum Planning -Discharge Summary Shared -PP Message Sent  Infant: Female-Erihn  APGARs 9, 9  3260g, 7lbs 3oz, 17in  Cherre Robins, CNM  09/20/2022 3:52 PM  Delivery Report: Review the  Delivery Report for details.

## 2022-04-18 LAB — PANORAMA PRENATAL TEST FULL PANEL:PANORAMA TEST PLUS 5 ADDITIONAL MICRODELETIONS: FETAL FRACTION: 9.7

## 2022-04-19 LAB — COMPREHENSIVE METABOLIC PANEL
ALT: 13 IU/L (ref 0–32)
AST: 13 IU/L (ref 0–40)
Albumin/Globulin Ratio: 1.4 (ref 1.2–2.2)
Albumin: 3.9 g/dL (ref 3.9–4.9)
Alkaline Phosphatase: 55 IU/L (ref 44–121)
BUN/Creatinine Ratio: 13 (ref 9–23)
BUN: 7 mg/dL (ref 6–20)
Bilirubin Total: <0.2 mg/dL (ref 0.0–1.2)
CO2: 18 mmol/L — ABNORMAL LOW (ref 20–29)
Calcium: 9.6 mg/dL (ref 8.7–10.2)
Chloride: 102 mmol/L (ref 96–106)
Creatinine, Ser: 0.52 mg/dL — ABNORMAL LOW (ref 0.57–1.00)
Globulin, Total: 2.7 g/dL (ref 1.5–4.5)
Glucose: 141 mg/dL — ABNORMAL HIGH (ref 70–99)
Potassium: 3.9 mmol/L (ref 3.5–5.2)
Sodium: 137 mmol/L (ref 134–144)
Total Protein: 6.6 g/dL (ref 6.0–8.5)
eGFR: 127 mL/min/{1.73_m2} (ref >59)

## 2022-04-19 LAB — HCV INTERPRETATION

## 2022-04-19 LAB — CBC/D/PLT+RPR+RH+ABO+RUBIGG...
Antibody Screen: NEGATIVE
Basophils Absolute: 0 10*3/uL (ref 0.0–0.2)
Basos: 0 %
EOS (ABSOLUTE): 0.2 10*3/uL (ref 0.0–0.4)
Eos: 3 %
HCV Ab: NONREACTIVE
HIV Screen 4th Generation wRfx: NONREACTIVE
Hematocrit: 35.7 % (ref 34.0–46.6)
Hemoglobin: 11.8 g/dL (ref 11.1–15.9)
Hepatitis B Surface Ag: NEGATIVE
Immature Grans (Abs): 0 10*3/uL (ref 0.0–0.1)
Immature Granulocytes: 0 %
Lymphocytes Absolute: 2.4 10*3/uL (ref 0.7–3.1)
Lymphs: 27 %
MCH: 29.2 pg (ref 26.6–33.0)
MCHC: 33.1 g/dL (ref 31.5–35.7)
MCV: 88 fL (ref 79–97)
Monocytes Absolute: 0.4 10*3/uL (ref 0.1–0.9)
Monocytes: 5 %
Neutrophils Absolute: 5.8 10*3/uL (ref 1.4–7.0)
Neutrophils: 65 %
Platelets: 338 10*3/uL (ref 150–450)
RBC: 4.04 x10E6/uL (ref 3.77–5.28)
RDW: 14 % (ref 11.7–15.4)
RPR Ser Ql: NONREACTIVE
Rh Factor: POSITIVE
Rubella Antibodies, IGG: 6.66 index (ref >0.99)
WBC: 9 10*3/uL (ref 3.4–10.8)

## 2022-04-19 LAB — AFP, SERUM, OPEN SPINA BIFIDA
AFP MoM: 0.43
AFP Value: 11.3 ng/mL
Gest. Age on Collection Date: 15.9 weeks
Maternal Age At EDD: 33.7 yr
OSBR Risk 1 IN: 10000
Test Results:: NEGATIVE
Weight: 266 [lb_av]

## 2022-04-19 LAB — HEMOGLOBIN A1C
Est. average glucose Bld gHb Est-mCnc: 117 mg/dL
Hgb A1c MFr Bld: 5.7 % — ABNORMAL HIGH (ref 4.8–5.6)

## 2022-04-19 LAB — PROTEIN / CREATININE RATIO, URINE
Creatinine, Urine: 246.8 mg/dL
Protein, Ur: 13.5 mg/dL
Protein/Creat Ratio: 55 mg/g creat (ref 0–200)

## 2022-04-20 ENCOUNTER — Telehealth: Payer: Self-pay

## 2022-04-20 LAB — HORIZON CUSTOM: REPORT SUMMARY: NEGATIVE

## 2022-04-20 NOTE — Telephone Encounter (Addendum)
-----   Message from Donnamae Jude, MD sent at 04/19/2022  4:56 PM EST ----- Has elevated A1C--bring in for early 2 hour  Results reviewed with patient. 2 HR GTT scheduled for 04/27/22.

## 2022-04-26 ENCOUNTER — Other Ambulatory Visit: Payer: Self-pay

## 2022-04-26 ENCOUNTER — Encounter: Payer: Self-pay | Admitting: Family Medicine

## 2022-04-26 ENCOUNTER — Ambulatory Visit (INDEPENDENT_AMBULATORY_CARE_PROVIDER_SITE_OTHER): Payer: Medicaid Other | Admitting: Family Medicine

## 2022-04-26 VITALS — BP 125/78 | HR 87 | Wt 266.8 lb

## 2022-04-26 DIAGNOSIS — O98512 Other viral diseases complicating pregnancy, second trimester: Secondary | ICD-10-CM | POA: Diagnosis not present

## 2022-04-26 DIAGNOSIS — Z98891 History of uterine scar from previous surgery: Secondary | ICD-10-CM

## 2022-04-26 DIAGNOSIS — Z3A16 16 weeks gestation of pregnancy: Secondary | ICD-10-CM | POA: Diagnosis not present

## 2022-04-26 DIAGNOSIS — O0990 Supervision of high risk pregnancy, unspecified, unspecified trimester: Secondary | ICD-10-CM

## 2022-04-26 DIAGNOSIS — O09299 Supervision of pregnancy with other poor reproductive or obstetric history, unspecified trimester: Secondary | ICD-10-CM

## 2022-04-26 DIAGNOSIS — O1492 Unspecified pre-eclampsia, second trimester: Secondary | ICD-10-CM

## 2022-04-26 DIAGNOSIS — B009 Herpesviral infection, unspecified: Secondary | ICD-10-CM

## 2022-04-26 MED ORDER — ASPIRIN 81 MG PO TBEC: 81.0000 mg | DELAYED_RELEASE_TABLET | Freq: Every day | ORAL | 12 refills | Status: DC

## 2022-04-26 NOTE — Progress Notes (Signed)
INITIAL PRENATAL VISIT  Subjective:   Joanne Huff is being seen today for her first obstetrical visit.  This is not a planned pregnancy. This is a desired pregnancy.  She is at [redacted]w[redacted]d gestation by 6 wk [place]. Her obstetrical history is significant for  hx of preE, HSV-2, Hx C/S, obesity . Relationship with FOB: significant other, not living together. Patient does intend to breast feed. Pregnancy history fully reviewed.  Patient reports nausea and vomiting.  Indications for ASA therapy (per uptodate) One of the following: Previous pregnancy with preeclampsia, especially early onset and with an adverse outcome Yes Multifetal gestation No Chronic hypertension No Type 1 or 2 diabetes mellitus No Chronic kidney disease No Autoimmune disease (antiphospholipid syndrome, systemic lupus erythematosus) No  Two or more of the following: Nulliparity No Obesity (body mass index >30 kg/m2) Yes Family history of preeclampsia in mother or sister No Age ?35 years No Sociodemographic characteristics (African American race, low socioeconomic level) Yes Personal risk factors (eg, previous pregnancy with low birth weight or small for gestational age infant, previous adverse pregnancy outcome [eg, stillbirth], interval >10 years between pregnancies) No  Indications for early GDM screening  First-degree relative with diabetes Yes BMI >30kg/m2 Yes Age > 25 Yes Previous birth of an infant weighing ?4000 g No Gestational diabetes mellitus in a previous pregnancy No Glycated hemoglobin ?5.7 percent (39 mmol/mol), impaired glucose tolerance, or impaired fasting glucose on previous testing Yes High-risk race/ethnicity (eg, African American, Latino, Native American, Asian American, Pacific Islander) Yes Previous stillbirth of unknown cause No Maternal birthweight > 9 lbs Yes History of cardiovascular disease No Hypertension or on therapy for hypertension No High-density lipoprotein cholesterol level <35  mg/dL (0.90 mmol/L) and/or a triglyceride level >250 mg/dL (2.82 mmol/L) No Polycystic ovary syndrome No Physical inactivity No Other clinical condition associated with insulin resistance (eg, severe obesity, acanthosis nigricans) No Current use of glucocorticoids No   Early screening tests: A1c which was 5.7   Review of Systems:   Review of Systems  Objective:    Obstetric History OB History  Gravida Para Term Preterm AB Living  3 2 2  0 0 2  SAB IAB Ectopic Multiple Live Births  0 0 0 0 2    # Outcome Date GA Lbr Len/2nd Weight Sex Delivery Anes PTL Lv  3 Gravida           2 Term 08/20/18 [redacted]w[redacted]d  8 lb 9.9 oz (3.909 kg) F CS-LTranv Spinal  LIV     Complications: Breech birth, fetus 1  1 Term 09/02/14 [redacted]w[redacted]d 13:45 / 00:40 6 lb 6.3 oz (2.9 kg) M Vag-Spont EPI  LIV     Birth Comments: pre eclampsia    Obstetric Comments  2020 Breech    Past Medical History:  Diagnosis Date   Chlamydia    Headache    High risk HPV infection    HSV-1 infection    Infection    UTI   Marijuana use    Ovarian cyst    Pregnancy induced hypertension    Smoker    Trichimoniasis    Vaginal Pap smear, abnormal     Past Surgical History:  Procedure Laterality Date   CESAREAN SECTION N/A 08/20/2018   Procedure: CESAREAN SECTION;  Surgeon: Truett Mainland, DO;  Location: [facility] LD [facility]S;  Service: Obstetrics;  Laterality: N/A;   MOUTH SURGERY     2 teeth extracted    Current Outpatient Medications on File Prior to Visit  Medication Sig Dispense Refill   Blood Pressure Monitoring (BLOOD PRESSURE KIT) DEVI 1 Device by Does not apply route as needed. 1 each 0   Misc. Devices (GOJJI WEIGHT SCALE) MISC 1 Device by Does not apply route as needed. 1 each 0   Prenatal Vit-Fe Fumarate-FA (PRENATAL PO) Take by mouth.     valACYclovir (VALTREX) 500 MG tablet Take 500 mg by mouth 2 (two) times daily as needed. (Patient not taking: Reported on 02/03/2022)     No current facility-administered medications  on file prior to visit.    No Known Allergies  Social History:  reports that she has quit smoking. Her smoking use included cigarettes. She has a 6.50 pack-year smoking history. She has never used smokeless tobacco. She reports that she does not currently use alcohol. She reports that she does not currently use drugs after having used the following drugs: Marijuana.  Family History  Problem Relation Age of Onset   Cancer Mother    Hypertension Mother    Stroke Mother    Arthritis Father    Hypertension Father    Hypertension Maternal Grandmother    Diabetes Maternal Grandmother    Diabetes Paternal Grandmother     The following portions of the patient's history were reviewed and updated as appropriate: allergies, current medications, past family history, past medical history, past social history, past surgical history and problem list.  Review of Systems Per HPI   Physical Exam:  BP 125/78   Pulse 87   Wt 266 lb 12.8 oz (121 kg)   LMP 12/23/2021   Breastfeeding Unknown   BMI 38.28 kg/m  General: Appears well, no acute distress. Age appropriate. Cardiac: RRR, normal heart sounds, no murmurs Respiratory: CTAB, normal effort Abdomen: soft, nontender, nondistended Extremities: No edema or cyanosis. Skin: Warm and dry, no rashes noted Neuro: alert and oriented, no focal deficits Psych: normal affect FHR: 156   Assessment:    Pregnancy: G3P2002 1. Supervision of high risk pregnancy, antepartum No acute concerns.   2. 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 Hx of cesarean section Due to breech presentation. Desires repeat.   4. [redacted] weeks gestation of pregnancy - aspirin EC 81 MG tablet; Take 1 tablet (81 mg total) by mouth daily. Swallow whole.  Dispense: 30 tablet; Refill: 12 - Follow up in 4 weeks  5. Hx of preeclampsia, prior pregnancy, currently pregnant - aspirin EC 81 MG tablet; Take 1  tablet (81 mg total) by mouth daily. Swallow whole.  Dispense: 30 tablet; Refill: 12  Plan:     Initial labs drawn. Prenatal vitamins. Problem list reviewed and updated. Reviewed in detail the nature of the practice with collaborative care between  Genetic screening discussed: NIPS, LR female, AFP neg, Horizon neg x4 Role of ultrasound in pregnancy discussed; Anatomy US:  scheduled . Amniocentesis discussed: not indicated. Follow up in 4 weeks. Discussed clinic routines, schedule of care and testing, genetic screening options, involvement of students and residents under the direct supervision of APPs and doctors and presence of female providers. Pt verbalized understanding.  Future Appointments  Date Time Provider [place]  04/27/2022  9:30 AM [facility] LAB [facility]  05/17/2022  9:30 AM [facility] NURSE [facility]  05/17/2022  9:45 AM [facility] US5 [facility] [place]    Autry-Lott, Shonte Beutler, DO 04/26/2022 2:57 PM

## 2022-04-27 ENCOUNTER — Encounter: Payer: Self-pay | Admitting: *Deleted

## 2022-04-27 ENCOUNTER — Encounter (HOSPITAL_COMMUNITY): Payer: Self-pay | Admitting: Obstetrics and Gynecology

## 2022-04-27 ENCOUNTER — Other Ambulatory Visit: Payer: Medicaid Other

## 2022-04-27 ENCOUNTER — Inpatient Hospital Stay (HOSPITAL_COMMUNITY)
Admission: AD | Admit: 2022-04-27 | Discharge: 2022-04-27 | Disposition: A | Discharge status: Home / Self Care | Payer: Medicaid Other | Attending: Obstetrics and Gynecology | Admitting: Obstetrics and Gynecology

## 2022-04-27 ENCOUNTER — Other Ambulatory Visit: Payer: Self-pay

## 2022-04-27 DIAGNOSIS — Y9389 Activity, other specified: Secondary | ICD-10-CM | POA: Insufficient documentation

## 2022-04-27 DIAGNOSIS — Z3A16 16 weeks gestation of pregnancy: Secondary | ICD-10-CM | POA: Insufficient documentation

## 2022-04-27 DIAGNOSIS — O9A212 Injury, poisoning and certain other consequences of external causes complicating pregnancy, second trimester: Secondary | ICD-10-CM

## 2022-04-27 DIAGNOSIS — O26892 Other specified pregnancy related conditions, second trimester: Secondary | ICD-10-CM | POA: Diagnosis present

## 2022-04-27 LAB — URINALYSIS, ROUTINE W REFLEX MICROSCOPIC
Bilirubin Urine: NEGATIVE
Glucose, UA: NEGATIVE mg/dL
Hgb urine dipstick: NEGATIVE
Ketones, ur: NEGATIVE mg/dL
Nitrite: NEGATIVE
Protein, ur: NEGATIVE mg/dL
Specific Gravity, Urine: 1.019 (ref 1.005–1.030)
pH: 5 (ref 5.0–8.0)

## 2022-04-27 MED ORDER — CYCLOBENZAPRINE HCL 10 MG PO TABS: 10.0000 mg | ORAL_TABLET | Freq: Three times a day (TID) | ORAL | 0 refills | Status: DC | PRN

## 2022-04-27 NOTE — Discharge Instructions (Signed)
Return to abdominal pain, vaginal bleeding, or leaking watery fluid from your vagina.      Safe Medications in Pregnancy   Acne: Benzoyl Peroxide Salicylic Acid  Backache/Headache: Tylenol: 2 regular strength every 4 hours OR              2 Extra strength every 6 hours  Colds/Coughs/Allergies: Benadryl (alcohol free) 25 mg every 6 hours as needed Breath right strips Claritin Cepacol throat lozenges Chloraseptic throat spray Cold-Eeze- up to three times per day Cough drops, alcohol free Flonase (by prescription only) Guaifenesin Mucinex Robitussin DM (plain only, alcohol free) Saline nasal spray/drops Sudafed (pseudoephedrine) & Actifed ** use only after [redacted] weeks gestation and if you do not have high blood pressure Tylenol Vicks Vaporub Zinc lozenges Zyrtec   Constipation: Colace Ducolax suppositories Fleet enema Glycerin suppositories Metamucil Milk of magnesia Miralax Senokot Smooth move tea  Diarrhea: Kaopectate Imodium A-D  *NO pepto Bismol  Hemorrhoids: Anusol Anusol HC Preparation H Tucks  Indigestion: Tums Maalox Mylanta Zantac  Pepcid  Insomnia: Benadryl (alcohol free) 25mg  every 6 hours as needed Tylenol PM Unisom, no Gelcaps  Leg Cramps: Tums MagGel  Nausea/Vomiting:  Bonine Dramamine Emetrol Ginger extract Sea bands Meclizine  Nausea medication to take during pregnancy:  Unisom (doxylamine succinate 25 mg tablets) Take one tablet daily at bedtime. If symptoms are not adequately controlled, the dose can be increased to a maximum recommended dose of two tablets daily (1/2 tablet in the morning, 1/2 tablet mid-afternoon and one at bedtime). Vitamin B6 100mg  tablets. Take one tablet twice a day (up to 200 mg per day).  Skin Rashes: Aveeno products Benadryl cream or 25mg  every 6 hours as needed Calamine Lotion 1% cortisone cream  Yeast infection: Gyne-lotrimin 7 Monistat 7  Gum/tooth pain: Anbesol  **If taking  multiple medications, please check labels to avoid duplicating the same active ingredients **take medication as directed on the label ** Do not exceed 4000 mg of tylenol in 24 hours **Do not take medications that contain aspirin or ibuprofen
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
## 2022-05-01 ENCOUNTER — Other Ambulatory Visit: Payer: Medicaid Other

## 2022-05-17 ENCOUNTER — Ambulatory Visit: Payer: Medicaid Other | Attending: Family Medicine

## 2022-05-17 ENCOUNTER — Other Ambulatory Visit: Payer: Self-pay | Admitting: *Deleted

## 2022-05-17 ENCOUNTER — Ambulatory Visit: Payer: Medicaid Other | Admitting: *Deleted

## 2022-05-17 ENCOUNTER — Encounter: Payer: Self-pay | Admitting: *Deleted

## 2022-05-17 VITALS — BP 133/69 | HR 76

## 2022-05-17 DIAGNOSIS — O99212 Obesity complicating pregnancy, second trimester: Secondary | ICD-10-CM | POA: Diagnosis not present

## 2022-05-17 DIAGNOSIS — O98512 Other viral diseases complicating pregnancy, second trimester: Secondary | ICD-10-CM | POA: Diagnosis not present

## 2022-05-17 DIAGNOSIS — O34219 Maternal care for unspecified type scar from previous cesarean delivery: Secondary | ICD-10-CM

## 2022-05-17 DIAGNOSIS — O0990 Supervision of high risk pregnancy, unspecified, unspecified trimester: Secondary | ICD-10-CM | POA: Diagnosis present

## 2022-05-17 DIAGNOSIS — Z3A19 19 weeks gestation of pregnancy: Secondary | ICD-10-CM

## 2022-05-17 DIAGNOSIS — E669 Obesity, unspecified: Secondary | ICD-10-CM | POA: Diagnosis not present

## 2022-05-17 DIAGNOSIS — Z362 Encounter for other antenatal screening follow-up: Secondary | ICD-10-CM

## 2022-05-17 DIAGNOSIS — Z3689 Encounter for other specified antenatal screening: Secondary | ICD-10-CM

## 2022-05-17 DIAGNOSIS — B009 Herpesviral infection, unspecified: Secondary | ICD-10-CM

## 2022-05-17 DIAGNOSIS — O09299 Supervision of pregnancy with other poor reproductive or obstetric history, unspecified trimester: Secondary | ICD-10-CM

## 2022-05-17 DIAGNOSIS — O09292 Supervision of pregnancy with other poor reproductive or obstetric history, second trimester: Secondary | ICD-10-CM

## 2022-05-25 ENCOUNTER — Other Ambulatory Visit (HOSPITAL_COMMUNITY)
Admission: RE | Admit: 2022-05-25 | Discharge: 2022-05-25 | Disposition: A | Discharge status: Home / Self Care | Payer: Medicaid Other | Source: Ambulatory Visit | Attending: Obstetrics and Gynecology | Admitting: Obstetrics and Gynecology

## 2022-05-25 ENCOUNTER — Encounter: Payer: Self-pay | Admitting: Obstetrics and Gynecology

## 2022-05-25 ENCOUNTER — Ambulatory Visit (INDEPENDENT_AMBULATORY_CARE_PROVIDER_SITE_OTHER): Payer: Medicaid Other | Admitting: Obstetrics and Gynecology

## 2022-05-25 ENCOUNTER — Other Ambulatory Visit: Payer: Self-pay

## 2022-05-25 VITALS — BP 113/75 | HR 76 | Wt 272.6 lb

## 2022-05-25 DIAGNOSIS — O0990 Supervision of high risk pregnancy, unspecified, unspecified trimester: Secondary | ICD-10-CM

## 2022-05-25 DIAGNOSIS — Z3A20 20 weeks gestation of pregnancy: Secondary | ICD-10-CM

## 2022-05-25 DIAGNOSIS — O3662X0 Maternal care for excessive fetal growth, second trimester, not applicable or unspecified: Secondary | ICD-10-CM

## 2022-05-25 DIAGNOSIS — O09292 Supervision of pregnancy with other poor reproductive or obstetric history, second trimester: Secondary | ICD-10-CM

## 2022-05-25 DIAGNOSIS — O24419 Gestational diabetes mellitus in pregnancy, unspecified control: Secondary | ICD-10-CM | POA: Insufficient documentation

## 2022-05-25 DIAGNOSIS — Z98891 History of uterine scar from previous surgery: Secondary | ICD-10-CM

## 2022-05-25 DIAGNOSIS — O0992 Supervision of high risk pregnancy, unspecified, second trimester: Secondary | ICD-10-CM

## 2022-05-25 DIAGNOSIS — Z6838 Body mass index (BMI) 38.0-38.9, adult: Secondary | ICD-10-CM | POA: Insufficient documentation

## 2022-05-25 DIAGNOSIS — Z23 Encounter for immunization: Secondary | ICD-10-CM

## 2022-05-25 DIAGNOSIS — O09299 Supervision of pregnancy with other poor reproductive or obstetric history, unspecified trimester: Secondary | ICD-10-CM

## 2022-05-25 DIAGNOSIS — O99810 Abnormal glucose complicating pregnancy: Secondary | ICD-10-CM

## 2022-05-25 NOTE — Progress Notes (Signed)
   PRENATAL VISIT NOTE  Subjective:  Joanne Huff is a 32 y.o. G3P2002 at [redacted]w[redacted]d being seen today for ongoing prenatal care.  She is currently monitored for the following issues for this high-risk pregnancy and has HSV-2 infection complicating pregnancy; Obesity in pregnancy; Hx of preeclampsia, prior pregnancy, currently pregnant; Hx of cesarean section; Supervision of high risk pregnancy, antepartum; BMI 38.0-38.9,adult; Excessive fetal growth affecting management of mother in second trimester, antepartum; and Abnormal glucose affecting pregnancy on their problem list.  Patient reports no complaints.  Contractions: Not present. Vag. Bleeding: None.  Movement: Present. Denies leaking of fluid.   The following portions of the patient's history were reviewed and updated as appropriate: allergies, current medications, past family history, past medical history, past social history, past surgical history and problem list.   Objective:   Vitals:   05/25/22 1014  BP: 113/75  Pulse: 76  Weight: 272 lb 9.6 oz (123.7 kg)    Fetal Status: Fetal Heart Rate (bpm): 153   Movement: Present     General:  Alert, oriented and cooperative. Patient is in no acute distress.  Skin: Skin is warm and dry. No rash noted.   Cardiovascular: Normal heart rate noted  Respiratory: Normal respiratory effort, no problems with respiration noted  Abdomen: Soft, gravid, appropriate for gestational age.  Pain/Pressure: Present     Pelvic: Cervical exam deferred        Extremities: Normal range of motion.  Edema: None  Mental Status: Normal mood and affect. Normal behavior. Normal judgment and thought content.   Assessment and Plan:  Pregnancy: G3P2002 at [redacted]w[redacted]d 1. Supervision of high risk pregnancy, antepartum Routine care. F/u completion scan. Pt confirms on low dose asa - Flu Vaccine QUAD 64mo+IM (Fluarix, Fluzone & Alfiuria Quad PF) - Cytology - PAP( [place])  2. [redacted] weeks gestation of pregnancy  3. Hx of  cesarean section 2020 PLTCS for breech with h/o prior vag delivery. D/w pt later re: delivery mode  4. Hx of preeclampsia, prior pregnancy, currently pregnant  5. Excessive fetal growth affecting management of pregnancy in second trimester, single or unspecified fetus At anatomy u/s. Pt not aware about slightly elevated A1c and unable to stay for 1h GTT today. Pt to come back for early 2h GTT  6. Abnormal glucose affecting pregnancy  Preterm labor symptoms and general obstetric precautions including but not limited to vaginal bleeding, contractions, leaking of fluid and fetal movement were reviewed in detail with the patient. Please refer to After Visit Summary for other counseling recommendations.   Return in about 3 weeks (around 06/15/2022) for in person, low risk ob, md or app.  Future Appointments  Date Time Provider [place]  05/31/2022  8:20 AM [facility] LAB [facility]  06/14/2022  9:30 AM [facility] NURSE [facility]  06/14/2022  9:45 AM [facility] US6 [facility]  06/15/2022 10:55 AM Kennon Rounds, Standley Dakins, MD [facility]    Aletha Halim, MD

## 2022-05-25 NOTE — Addendum Note (Signed)
Addended by: Langston Reusing on: 05/25/2022 04:43 PM   Modules accepted: Orders

## 2022-05-30 LAB — CYTOLOGY - PAP
Comment: NEGATIVE
Diagnosis: NEGATIVE
High risk HPV: NEGATIVE

## 2022-05-31 ENCOUNTER — Other Ambulatory Visit: Payer: Medicaid Other

## 2022-06-14 ENCOUNTER — Ambulatory Visit: Payer: Medicaid Other

## 2022-06-15 ENCOUNTER — Ambulatory Visit: Payer: Medicaid Other | Admitting: Family Medicine

## 2022-06-15 VITALS — BP 125/80 | HR 93 | Wt 272.8 lb

## 2022-06-15 DIAGNOSIS — O0993 Supervision of high risk pregnancy, unspecified, third trimester: Secondary | ICD-10-CM

## 2022-06-15 DIAGNOSIS — Z3A23 23 weeks gestation of pregnancy: Secondary | ICD-10-CM

## 2022-06-15 DIAGNOSIS — O0990 Supervision of high risk pregnancy, unspecified, unspecified trimester: Secondary | ICD-10-CM

## 2022-06-15 DIAGNOSIS — Z98891 History of uterine scar from previous surgery: Secondary | ICD-10-CM

## 2022-06-15 DIAGNOSIS — O99810 Abnormal glucose complicating pregnancy: Secondary | ICD-10-CM

## 2022-06-15 NOTE — Progress Notes (Signed)
   PRENATAL VISIT NOTE  Subjective:  Joanne Huff is a 32 y.o. G3P2002 at 48w1dbeing seen today for ongoing prenatal care.  She is currently monitored for the following issues for this low-risk pregnancy and has HSV-2 infection complicating pregnancy; Obesity in pregnancy; Hx of preeclampsia, prior pregnancy, currently pregnant; Hx of cesarean section; Supervision of high risk pregnancy, antepartum; BMI 38.0-38.9,adult; Excessive fetal growth affecting management of mother in second trimester, antepartum; and Abnormal glucose affecting pregnancy on their problem list.  Patient reports no complaints.  Contractions: Not present. Vag. Bleeding: None.  Movement: Present. Denies leaking of fluid.   The following portions of the patient's history were reviewed and updated as appropriate: allergies, current medications, past family history, past medical history, past social history, past surgical history and problem list.   Objective:   Vitals:   06/15/22 1053  BP: 125/80  Pulse: 93  Weight: 272 lb 12.8 oz (123.7 kg)    Fetal Status: Fetal Heart Rate (bpm): 144 Fundal Height: 22 cm Movement: Present     General:  Alert, oriented and cooperative. Patient is in no acute distress.  Skin: Skin is warm and dry. No rash noted.   Cardiovascular: Normal heart rate noted  Respiratory: Normal respiratory effort, no problems with respiration noted  Abdomen: Soft, gravid, appropriate for gestational age.  Pain/Pressure: Absent     Pelvic: Cervical exam deferred        Extremities: Normal range of motion.  Edema: None  Mental Status: Normal mood and affect. Normal behavior. Normal judgment and thought content.   Assessment and Plan:  Pregnancy: G3P2002 at 232w1d. Supervision of high risk pregnancy, antepartum Continue routine prenatal care.  2. Hx of cesarean section For TOLAC + BTL, consents signed today  3. Abnormal glucose affecting pregnancy Lab could not do a 2 hour today, to return next  week  Preterm labor symptoms and general obstetric precautions including but not limited to vaginal bleeding, contractions, leaking of fluid and fetal movement were reviewed in detail with the patient. Please refer to After Visit Summary for other counseling recommendations.   Return in 4 weeks (on 07/13/2022).  Future Appointments  Date Time Provider [place]3/09/2022  9:10 AM [facility]  1:15 PM [facility]  1:30 PM [facility]04[facility]  8:15 AM Anyanwu, UgSallyanne HaversMD [facility]  Joanne JudeMD

## 2022-06-19 ENCOUNTER — Encounter: Payer: Self-pay | Admitting: *Deleted

## 2022-06-21 ENCOUNTER — Other Ambulatory Visit: Payer: Self-pay

## 2022-06-21 ENCOUNTER — Ambulatory Visit (HOSPITAL_BASED_OUTPATIENT_CLINIC_OR_DEPARTMENT_OTHER): Payer: Medicaid Other

## 2022-06-21 ENCOUNTER — Other Ambulatory Visit: Payer: Self-pay | Admitting: *Deleted

## 2022-06-21 ENCOUNTER — Ambulatory Visit: Payer: Medicaid Other | Attending: Maternal & Fetal Medicine | Admitting: *Deleted

## 2022-06-21 ENCOUNTER — Other Ambulatory Visit: Payer: Medicaid Other

## 2022-06-21 ENCOUNTER — Encounter: Payer: Self-pay | Admitting: *Deleted

## 2022-06-21 VITALS — BP 134/64 | HR 96

## 2022-06-21 DIAGNOSIS — O99210 Obesity complicating pregnancy, unspecified trimester: Secondary | ICD-10-CM

## 2022-06-21 DIAGNOSIS — O09299 Supervision of pregnancy with other poor reproductive or obstetric history, unspecified trimester: Secondary | ICD-10-CM

## 2022-06-21 DIAGNOSIS — O3662X0 Maternal care for excessive fetal growth, second trimester, not applicable or unspecified: Secondary | ICD-10-CM | POA: Diagnosis not present

## 2022-06-21 DIAGNOSIS — O99212 Obesity complicating pregnancy, second trimester: Secondary | ICD-10-CM | POA: Insufficient documentation

## 2022-06-21 DIAGNOSIS — Z362 Encounter for other antenatal screening follow-up: Secondary | ICD-10-CM

## 2022-06-21 DIAGNOSIS — Z3A24 24 weeks gestation of pregnancy: Secondary | ICD-10-CM

## 2022-06-21 DIAGNOSIS — O09292 Supervision of pregnancy with other poor reproductive or obstetric history, second trimester: Secondary | ICD-10-CM | POA: Diagnosis not present

## 2022-06-21 DIAGNOSIS — E669 Obesity, unspecified: Secondary | ICD-10-CM

## 2022-06-21 DIAGNOSIS — O0990 Supervision of high risk pregnancy, unspecified, unspecified trimester: Secondary | ICD-10-CM

## 2022-06-21 DIAGNOSIS — O99810 Abnormal glucose complicating pregnancy: Secondary | ICD-10-CM

## 2022-06-21 DIAGNOSIS — O98512 Other viral diseases complicating pregnancy, second trimester: Secondary | ICD-10-CM

## 2022-06-21 DIAGNOSIS — O99213 Obesity complicating pregnancy, third trimester: Secondary | ICD-10-CM

## 2022-06-21 DIAGNOSIS — B009 Herpesviral infection, unspecified: Secondary | ICD-10-CM | POA: Diagnosis not present

## 2022-06-21 DIAGNOSIS — Z3689 Encounter for other specified antenatal screening: Secondary | ICD-10-CM

## 2022-06-21 DIAGNOSIS — O3663X0 Maternal care for excessive fetal growth, third trimester, not applicable or unspecified: Secondary | ICD-10-CM

## 2022-06-21 DIAGNOSIS — O34219 Maternal care for unspecified type scar from previous cesarean delivery: Secondary | ICD-10-CM

## 2022-06-22 LAB — GLUCOSE TOLERANCE, 2 HOURS W/ 1HR
Glucose, 1 hour: 142 mg/dL (ref 70–179)
Glucose, 2 hour: 155 mg/dL — ABNORMAL HIGH (ref 70–152)
Glucose, Fasting: 78 mg/dL (ref 70–91)

## 2022-06-22 LAB — CBC
Hematocrit: 35.2 % (ref 34.0–46.6)
Hemoglobin: 12 g/dL (ref 11.1–15.9)
MCH: 29.6 pg (ref 26.6–33.0)
MCHC: 34.1 g/dL (ref 31.5–35.7)
MCV: 87 fL (ref 79–97)
Platelets: 362 10*3/uL (ref 150–450)
RBC: 4.05 x10E6/uL (ref 3.77–5.28)
RDW: 13.1 % (ref 11.7–15.4)
WBC: 6.4 10*3/uL (ref 3.4–10.8)

## 2022-06-22 LAB — HIV ANTIBODY (ROUTINE TESTING W REFLEX): HIV Screen 4th Generation wRfx: NONREACTIVE

## 2022-06-22 LAB — RPR: RPR Ser Ql: NONREACTIVE

## 2022-06-26 ENCOUNTER — Encounter: Payer: Self-pay | Admitting: Obstetrics and Gynecology

## 2022-07-17 ENCOUNTER — Encounter: Payer: Self-pay | Admitting: Obstetrics & Gynecology

## 2022-07-17 ENCOUNTER — Ambulatory Visit (INDEPENDENT_AMBULATORY_CARE_PROVIDER_SITE_OTHER): Payer: Medicaid Other | Admitting: Obstetrics & Gynecology

## 2022-07-17 ENCOUNTER — Other Ambulatory Visit: Payer: Self-pay

## 2022-07-17 VITALS — BP 130/76 | HR 86 | Wt 273.3 lb

## 2022-07-17 DIAGNOSIS — O99212 Obesity complicating pregnancy, second trimester: Secondary | ICD-10-CM

## 2022-07-17 DIAGNOSIS — O0992 Supervision of high risk pregnancy, unspecified, second trimester: Secondary | ICD-10-CM

## 2022-07-17 DIAGNOSIS — O3662X0 Maternal care for excessive fetal growth, second trimester, not applicable or unspecified: Secondary | ICD-10-CM

## 2022-07-17 DIAGNOSIS — O09292 Supervision of pregnancy with other poor reproductive or obstetric history, second trimester: Secondary | ICD-10-CM

## 2022-07-17 DIAGNOSIS — O24419 Gestational diabetes mellitus in pregnancy, unspecified control: Secondary | ICD-10-CM

## 2022-07-17 DIAGNOSIS — Z3A27 27 weeks gestation of pregnancy: Secondary | ICD-10-CM

## 2022-07-17 DIAGNOSIS — O09299 Supervision of pregnancy with other poor reproductive or obstetric history, unspecified trimester: Secondary | ICD-10-CM

## 2022-07-17 DIAGNOSIS — Z23 Encounter for immunization: Secondary | ICD-10-CM

## 2022-07-17 DIAGNOSIS — O0990 Supervision of high risk pregnancy, unspecified, unspecified trimester: Secondary | ICD-10-CM

## 2022-07-17 DIAGNOSIS — Z98891 History of uterine scar from previous surgery: Secondary | ICD-10-CM

## 2022-07-17 DIAGNOSIS — O99210 Obesity complicating pregnancy, unspecified trimester: Secondary | ICD-10-CM

## 2022-07-17 MED ORDER — ACCU-CHEK GUIDE VI STRP: ORAL_STRIP | 12 refills | Status: DC

## 2022-07-17 MED ORDER — ACCU-CHEK GUIDE W/DEVICE KIT: 1.0000 | PACK | Freq: Four times a day (QID) | 0 refills | Status: DC

## 2022-07-17 MED ORDER — ACCU-CHEK SOFTCLIX LANCETS MISC: 12 refills | Status: DC

## 2022-07-17 NOTE — Progress Notes (Signed)
PRENATAL VISIT NOTE  Subjective:  Joanne Huff is a 32 y.o. G3P2002 at [redacted]w[redacted]d being seen today for ongoing prenatal care.  She is currently monitored for the following issues for this high-risk pregnancy and has HSV-2 infection complicating pregnancy; Obesity in pregnancy; Hx of preeclampsia, prior pregnancy, currently pregnant; Hx of cesarean section; Supervision of high risk pregnancy, antepartum; BMI 38.0-38.9,adult; Excessive fetal growth affecting management of mother in second trimester, antepartum; and GDM (gestational diabetes mellitus) on their problem list.  Patient reports no complaints.  Contractions: Irritability. Vag. Bleeding: None.  Movement: Present. Denies leaking of fluid.   The following portions of the patient's history were reviewed and updated as appropriate: allergies, current medications, past family history, past medical history, past social history, past surgical history and problem list.   Objective:   Vitals:   07/17/22 0818  BP: 130/76  Pulse: 86  Weight: 273 lb 4.8 oz (124 kg)    Fetal Status: Fetal Heart Rate (bpm): 144   Movement: Present     General:  Alert, oriented and cooperative. Patient is in no acute distress.  Skin: Skin is warm and dry. No rash noted.   Cardiovascular: Normal heart rate noted  Respiratory: Normal respiratory effort, no problems with respiration noted  Abdomen: Soft, gravid, appropriate for gestational age.  Pain/Pressure: Absent     Pelvic: Cervical exam deferred        Extremities: Normal range of motion.  Edema: None  Mental Status: Normal mood and affect. Normal behavior. Normal judgment and thought content.   Imaging: [place] MFM OB FOLLOW UP  Result Date: 06/21/2022 ----------------------------------------------------------------------  OBSTETRICS REPORT                       (Signed Final 06/21/2022 02:09 pm) ---------------------------------------------------------------------- Patient Info  ID #:       [id number]                           D.O.B.:  07/11/1990 (31 yrs)  Name:       Joanne Huff                   Visit Date: 06/21/2022 01:14 pm ---------------------------------------------------------------------- Performed By  Attending:        Valeda Malm DO       Ref. Address:     [address]                                                             [place], [place]  Performed By:     Stephenie Acres        [facility]                                                             [place] [facility]                                                             Women  Referred By:      [facility]                    for Women ---------------------------------------------------------------------- Orders  #  Description                           Code        Ordered By  1  [place] MFM OB FOLLOW UP                   [phone]    Valeda Malm ----------------------------------------------------------------------  #  Order #                     Accession #                Episode #  1  [id number]                   [id number]                 [id number] ---------------------------------------------------------------------- Indications  Obesity complicating pregnancy, second         O99.212  trimester (BMI 37)  Poor obstetric history: Previous preeclampsia  O09.299  Herpes simplex virus (HSV) type 2              O98.519 B00.9  History of cesarean delivery, currently        O34.219  pregnant  Low risk NIPS  [redacted] weeks gestation of pregnancy                Z3A.24  Antenatal follow-up for nonvisualized fetal    Z36.2  anatomy  Large for gestational age fetus affecting      O63.60X0  management of mother ---------------------------------------------------------------------- Vital Signs  BP:          134/64 ---------------------------------------------------------------------- Fetal Evaluation  Num Of  Fetuses:         1  Fetal Heart Rate(bpm):  153  Cardiac Activity:       Observed  Presentation:           Cephalic  Placenta:               Posterior  P. Cord Insertion:      Previously visualized  Amniotic Fluid  AFI FV:      Within normal limits                              Largest Pocket(cm)  5.73 ---------------------------------------------------------------------- Biometry  BPD:      63.2  mm     G. Age:  25w 4d         91  %    CI:        77.41   %    70 - 86                                                          FL/HC:      20.6   %    18.7 - 20.9  HC:      227.4  mm     G. Age:  24w 5d         62  %    HC/AC:      1.08        1.05 - 1.21  AC:      210.5  mm     G. Age:  25w 4d         86  %    FL/BPD:     74.2   %    71 - 87  FL:       46.9  mm     G. Age:  25w 4d         85  %    FL/AC:      22.3   %    20 - 24  Est. FW:     820  gm    1 lb 13 oz      96  % ---------------------------------------------------------------------- OB History  Gravidity:    3         Term:   2  Living:       2 ---------------------------------------------------------------------- Gestational Age  LMP:           25w 5d        Date:  12/23/21                   EDD:   09/29/22  U/S Today:     25w 3d                                        EDD:   10/01/22  Best:          24w 0d     Det. By:  Loman Chroman         EDD:   10/11/22                                      (02/16/22) ---------------------------------------------------------------------- Anatomy  Cranium:               Appears normal         Aortic Arch:            Appears normal  Cavum:                 Previously seen        Ductal Arch:            Appears normal  Ventricles:  Previously seen        Diaphragm:              Appears normal  Choroid Plexus:        Previously seen        Stomach:                Appears normal, left                                                                        sided  Cerebellum:             Previously seen        Abdomen:                Appears normal  Posterior Fossa:       Previously seen        Abdominal Wall:         Previously seen  Nuchal Fold:           Previously seen        Cord Vessels:           Previously seen  Face:                  Orbits nl; profile not Kidneys:                Appear normal                         well visualized  Lips:                  Appears normal         Bladder:                Appears normal  Thoracic:              Appears normal         Spine:                  Previously seen  Heart:                 Appears normal         Upper Extremities:      Previously seen                         (4CH, axis, and                         situs)  RVOT:                  Appears normal         Lower Extremities:      Previously seen  LVOT:                  Appears normal  Other:  Fetus appears to be female. Heels/feet and open hands/5th digits          previously visualized. VC, 3VV, 3VTV, Nasal bone, lenses, maxilla,          mandible and visualized ---------------------------------------------------------------------- Cervix Uterus Adnexa  Cervix  Not  visualized (advanced GA >24wks) ---------------------------------------------------------------------- Comments  The patient is here for a follow-up ultrasound at 24w 0d. EDD:  10/11/2022 dated by Early [facility]  (02/16/22). She has  no concerns today. BP is 134/64. She denies headaches or  vision changes. Her daughter was about 8lb9oz.  Sonographic findings  Single intrauterine pregnancy.  Fetal cardiac activity:  Observed and appears normal.  Presentation: Cephalic.  Interval fetal anatomy appears normal.  Fetal biometry shows the estimated fetal weight at the 96  percentile.  Amniotic fluid volume: Within normal limits. MVP: 5.73 cm.  Placenta: Posterior.  Recommendations  - F/u anatomy in 4 weeks  - F/u on GTT results (fetus is LGA today) ----------------------------------------------------------------------                  Valeda Malm, DO Electronically Signed Final Report   06/21/2022 02:09 pm ----------------------------------------------------------------------   Assessment and Plan:  Pregnancy: G3P2002 at [redacted]w[redacted]d 1. Gestational diabetes mellitus (GDM) in second trimester, gestational diabetes method of control unspecified Discussed implications of GDM in pregnancy, need for optimizing glycemic control to decrease DM associated maternal-fetal morbidity and mortality, need for frequent ultrasounds/prenatal visits and possible antenatal testing later in pregnancy. Referred for DM education, supplies ordered.  Discussed BS testing four times a day, information given to patient. Proper diet and exercise discussed.  - glucose blood (ACCU-CHEK GUIDE) test strip; Use to check blood sugars four times a day was instructed  Dispense: 50 each; Refill: 12 - Blood Glucose Monitoring Suppl (ACCU-CHEK GUIDE) w/Device KIT; 1 Device by Does not apply route 4 (four) times daily.  Dispense: 1 kit; Refill: 0 - Accu-Chek Softclix Lancets lancets; Use as instructed  Dispense: 100 each; Refill: 12 - Amb Referral to Nutrition and Diabetic Education  2. Excessive fetal growth affecting management of pregnancy in second trimester, single or unspecified fetus EFW 96% last scan.  Next scan on 07/25/22.   3. Hx of preeclampsia, prior pregnancy, currently pregnant Stable BP, continue ASA.  4. Obesity in pregnancy TWG 14 lbs.   5. Hx of cesarean section Desires TOLAC, consented on 06/15/22  6. Need for Tdap vaccination - Tdap vaccine greater than or equal to 7yo IM given today.  7. [redacted] weeks gestation of pregnancy 8. Supervision of high risk pregnancy, antepartum No other concerns. Preterm labor symptoms and general obstetric precautions including but not limited to vaginal bleeding, contractions, leaking of fluid and fetal movement were reviewed in detail with the patient. Please refer to After Visit Summary for other counseling  recommendations.   Return in about 2 weeks (around 07/31/2022) for OFFICE OB VISIT (MD only).  Future Appointments  Date Time Provider [place]  07/25/2022 10:15 AM [facility] NURSE [facility]  07/25/2022 10:30 AM [facility] US2 [facility]  07/31/2022  8:55 AM Woodroe Mode, MD [facility]    Verita Schneiders, MD

## 2022-07-17 NOTE — Patient Instructions (Signed)
Return to office for any scheduled appointments. Call the office or go to the MAU at [facility] if: You begin to have strong, frequent contractions Your water breaks.  Sometimes it is a big gush of fluid, sometimes it is just a trickle that keeps getting your underwear wet or running down your legs You have vaginal bleeding.  It is normal to have a small amount of spotting if your cervix was checked.  You do not feel your baby moving like normal.  If you do not, get something to eat and drink and lay down and focus on feeling your baby move.   If your baby is still not moving like normal, you should call the office or go to MAU. Any other obstetric concerns.  

## 2022-07-25 ENCOUNTER — Other Ambulatory Visit: Payer: Self-pay | Admitting: *Deleted

## 2022-07-25 ENCOUNTER — Ambulatory Visit: Payer: Medicaid Other | Attending: Maternal & Fetal Medicine

## 2022-07-25 ENCOUNTER — Ambulatory Visit: Payer: Medicaid Other | Admitting: *Deleted

## 2022-07-25 VITALS — BP 132/73 | HR 82

## 2022-07-25 DIAGNOSIS — O24419 Gestational diabetes mellitus in pregnancy, unspecified control: Secondary | ICD-10-CM | POA: Diagnosis present

## 2022-07-25 DIAGNOSIS — E669 Obesity, unspecified: Secondary | ICD-10-CM

## 2022-07-25 DIAGNOSIS — O34219 Maternal care for unspecified type scar from previous cesarean delivery: Secondary | ICD-10-CM

## 2022-07-25 DIAGNOSIS — B009 Herpesviral infection, unspecified: Secondary | ICD-10-CM | POA: Diagnosis not present

## 2022-07-25 DIAGNOSIS — O3663X0 Maternal care for excessive fetal growth, third trimester, not applicable or unspecified: Secondary | ICD-10-CM

## 2022-07-25 DIAGNOSIS — O98513 Other viral diseases complicating pregnancy, third trimester: Secondary | ICD-10-CM | POA: Diagnosis not present

## 2022-07-25 DIAGNOSIS — O99213 Obesity complicating pregnancy, third trimester: Secondary | ICD-10-CM | POA: Diagnosis not present

## 2022-07-25 DIAGNOSIS — Z3A28 28 weeks gestation of pregnancy: Secondary | ICD-10-CM

## 2022-07-25 DIAGNOSIS — Z3689 Encounter for other specified antenatal screening: Secondary | ICD-10-CM

## 2022-07-25 DIAGNOSIS — O24410 Gestational diabetes mellitus in pregnancy, diet controlled: Secondary | ICD-10-CM | POA: Diagnosis not present

## 2022-07-25 DIAGNOSIS — O09293 Supervision of pregnancy with other poor reproductive or obstetric history, third trimester: Secondary | ICD-10-CM

## 2022-07-31 ENCOUNTER — Ambulatory Visit (INDEPENDENT_AMBULATORY_CARE_PROVIDER_SITE_OTHER): Payer: Medicaid Other | Admitting: Obstetrics & Gynecology

## 2022-07-31 ENCOUNTER — Other Ambulatory Visit: Payer: Self-pay

## 2022-07-31 VITALS — BP 117/77 | HR 93 | Wt 273.8 lb

## 2022-07-31 DIAGNOSIS — O0993 Supervision of high risk pregnancy, unspecified, third trimester: Secondary | ICD-10-CM | POA: Diagnosis not present

## 2022-07-31 DIAGNOSIS — Z3A29 29 weeks gestation of pregnancy: Secondary | ICD-10-CM

## 2022-07-31 DIAGNOSIS — O99210 Obesity complicating pregnancy, unspecified trimester: Secondary | ICD-10-CM

## 2022-07-31 DIAGNOSIS — O99213 Obesity complicating pregnancy, third trimester: Secondary | ICD-10-CM

## 2022-07-31 DIAGNOSIS — O24415 Gestational diabetes mellitus in pregnancy, controlled by oral hypoglycemic drugs: Secondary | ICD-10-CM

## 2022-07-31 DIAGNOSIS — O0990 Supervision of high risk pregnancy, unspecified, unspecified trimester: Secondary | ICD-10-CM

## 2022-07-31 MED ORDER — METFORMIN HCL 500 MG PO TABS: 500.0000 mg | ORAL_TABLET | Freq: Two times a day (BID) | ORAL | 5 refills | Status: DC

## 2022-07-31 NOTE — Progress Notes (Signed)
   PRENATAL VISIT NOTE  Subjective:  Joanne Huff is a 32 y.o. G3P2002 at [redacted]w[redacted]d being seen today for ongoing prenatal care.  She is currently monitored for the following issues for this high-risk pregnancy and has HSV-2 infection complicating pregnancy; Obesity in pregnancy; Hx of preeclampsia, prior pregnancy, currently pregnant; Hx of cesarean section; Supervision of high risk pregnancy, antepartum; BMI 38.0-38.9,adult; Excessive fetal growth affecting management of mother in second trimester, antepartum; and GDM (gestational diabetes mellitus) on their problem list.  Patient reports no complaints.  Contractions: Irritability. Vag. Bleeding: None.  Movement: Present. Denies leaking of fluid.   The following portions of the patient's history were reviewed and updated as appropriate: allergies, current medications, past family history, past medical history, past social history, past surgical history and problem list.   Objective:   Vitals:   07/31/22 0920  BP: 117/77  Pulse: 93  Weight: 273 lb 12.8 oz (124.2 kg)    Fetal Status: Fetal Heart Rate (bpm): 154   Movement: Present     General:  Alert, oriented and cooperative. Patient is in no acute distress.  Skin: Skin is warm and dry. No rash noted.   Cardiovascular: Normal heart rate noted  Respiratory: Normal respiratory effort, no problems with respiration noted  Abdomen: Soft, gravid, appropriate for gestational age. Fundal height to 29cm. Pain/Pressure: Absent     Pelvic: Cervical exam deferred        Extremities: Normal range of motion.  Edema: None  Mental Status: Normal mood and affect. Normal behavior. Normal judgment and thought content.   Assessment and Plan:  Pregnancy: G3P2002 at [redacted]w[redacted]d 1. Supervision of high risk pregnancy, antepartum - Routine prenatal care - F/u w/ MFM for [place] evaluations as appropriate   2. GDM - Continues to have elevated sugars, both fasting and non-fasting, with diet control alone  - Begin  metformin today, rx sent    Preterm labor symptoms and general obstetric precautions including but not limited to vaginal bleeding, contractions, leaking of fluid and fetal movement were reviewed in detail with the patient. Please refer to After Visit Summary for other counseling recommendations.   Return in about 2 weeks (around 08/14/2022).  Future Appointments  Date Time Provider Department Center  08/21/2022 10:15 AM Warden Fillers, MD [facility]  08/24/2022  7:30 AM [facility] NURSE [facility]  08/24/2022  7:45 AM [facility] US4 [facility]  09/19/2022  7:45 AM [facility] NURSE [facility]  09/19/2022  8:00 AM [facility] US1 [facility]    Rosario Adie, MS3 [employer]  07/31/22 10:09 AM   Attestation of Attending Supervision of Medical Student: Evaluation and management procedures were performed by the medical student under my supervision and collaboration.  I have reviewed the student's note and chart, and I agree with the management and plan.  Scheryl Darter, MD, FACOG Attending Obstetrician & Gynecologist Faculty Practice, [facility]

## 2022-08-21 ENCOUNTER — Ambulatory Visit (INDEPENDENT_AMBULATORY_CARE_PROVIDER_SITE_OTHER): Payer: Medicaid Other | Admitting: Obstetrics and Gynecology

## 2022-08-21 VITALS — BP 114/71 | HR 70 | Wt 275.0 lb

## 2022-08-21 DIAGNOSIS — B009 Herpesviral infection, unspecified: Secondary | ICD-10-CM

## 2022-08-21 DIAGNOSIS — O0990 Supervision of high risk pregnancy, unspecified, unspecified trimester: Secondary | ICD-10-CM

## 2022-08-21 DIAGNOSIS — Z98891 History of uterine scar from previous surgery: Secondary | ICD-10-CM

## 2022-08-21 DIAGNOSIS — O09299 Supervision of pregnancy with other poor reproductive or obstetric history, unspecified trimester: Secondary | ICD-10-CM

## 2022-08-21 DIAGNOSIS — O98513 Other viral diseases complicating pregnancy, third trimester: Secondary | ICD-10-CM

## 2022-08-21 DIAGNOSIS — O3662X0 Maternal care for excessive fetal growth, second trimester, not applicable or unspecified: Secondary | ICD-10-CM

## 2022-08-21 DIAGNOSIS — O24410 Gestational diabetes mellitus in pregnancy, diet controlled: Secondary | ICD-10-CM

## 2022-08-21 DIAGNOSIS — Z3A32 32 weeks gestation of pregnancy: Secondary | ICD-10-CM

## 2022-08-21 NOTE — Progress Notes (Signed)
   PRENATAL VISIT NOTE  Subjective:  Joanne Huff is a 32 y.o. G3P2002 at [redacted]w[redacted]d being seen today for ongoing prenatal care.  She is currently monitored for the following issues for this high-risk pregnancy and has HSV-2 infection complicating pregnancy; Obesity in pregnancy; Hx of preeclampsia, prior pregnancy, currently pregnant; Hx of cesarean section; Supervision of high risk pregnancy, antepartum; BMI 38.0-38.9,adult; Excessive fetal growth affecting management of mother in second trimester, antepartum; and GDM (gestational diabetes mellitus) on their problem list.  Patient doing well with no acute concerns today. She reports no complaints.  Contractions: Irritability. Vag. Bleeding: None.  Movement: Present. Denies leaking of fluid.   Pt discontinued the metformin due to GI intolerance and has been pursuing diet only.  Pt did not bring in any blood sugars to evaluate.  New glucose sheet given and will reassess.  The following portions of the patient's history were reviewed and updated as appropriate: allergies, current medications, past family history, past medical history, past social history, past surgical history and problem list. Problem list updated.  Objective:   Vitals:   08/21/22 1051 08/21/22 1123  BP: (!) 145/80 114/71  Pulse: 75 70  Weight: 275 lb (124.7 kg)     Fetal Status: Fetal Heart Rate (bpm): 141   Movement: Present     General:  Alert, oriented and cooperative. Patient is in no acute distress.  Skin: Skin is warm and dry. No rash noted.   Cardiovascular: Normal heart rate noted  Respiratory: Normal respiratory effort, no problems with respiration noted  Abdomen: Soft, gravid, appropriate for gestational age.  Pain/Pressure: Absent     Pelvic: Cervical exam deferred        Extremities: Normal range of motion.  Edema: None  Mental Status:  Normal mood and affect. Normal behavior. Normal judgment and thought content.   Assessment and Plan:  Pregnancy: G3P2002 at  [redacted]w[redacted]d  1. [redacted] weeks gestation of pregnancy   2. Diet controlled gestational diabetes mellitus (GDM) in third trimester Will check blood sugars all week and return with values, if there is no good control insulin would be next treatment modality  3. Supervision of high risk pregnancy, antepartum Continue routine prenatal care  4. Hx of preeclampsia, prior pregnancy, currently pregnant Initial BP was midly elevated, normal on recheck  5. Hx of cesarean section Pt currently desires TOLAC  6. Herpes simplex virus type 2 (HSV-2) infection affecting pregnancy in third trimester Prophylaxis at 36 weeks  7. Excessive fetal growth affecting management of pregnancy in second trimester, single or unspecified fetus Growth scan on 08/24/22  Preterm labor symptoms and general obstetric precautions including but not limited to vaginal bleeding, contractions, leaking of fluid and fetal movement were reviewed in detail with the patient.  Please refer to After Visit Summary for other counseling recommendations.   Return in about 1 week (around 08/28/2022) for [facility], in person.   Mariel Aloe, MD Faculty Attending Center for [facility]

## 2022-08-24 ENCOUNTER — Ambulatory Visit: Payer: Medicaid Other | Admitting: *Deleted

## 2022-08-24 ENCOUNTER — Other Ambulatory Visit: Payer: Self-pay | Admitting: *Deleted

## 2022-08-24 ENCOUNTER — Ambulatory Visit: Payer: Medicaid Other | Attending: Maternal & Fetal Medicine

## 2022-08-24 ENCOUNTER — Other Ambulatory Visit: Payer: Self-pay | Admitting: Maternal & Fetal Medicine

## 2022-08-24 VITALS — BP 143/77 | HR 70

## 2022-08-24 DIAGNOSIS — O24410 Gestational diabetes mellitus in pregnancy, diet controlled: Secondary | ICD-10-CM | POA: Diagnosis present

## 2022-08-24 DIAGNOSIS — B009 Herpesviral infection, unspecified: Secondary | ICD-10-CM

## 2022-08-24 DIAGNOSIS — O3663X0 Maternal care for excessive fetal growth, third trimester, not applicable or unspecified: Secondary | ICD-10-CM | POA: Diagnosis present

## 2022-08-24 DIAGNOSIS — O09293 Supervision of pregnancy with other poor reproductive or obstetric history, third trimester: Secondary | ICD-10-CM

## 2022-08-24 DIAGNOSIS — O34219 Maternal care for unspecified type scar from previous cesarean delivery: Secondary | ICD-10-CM

## 2022-08-24 DIAGNOSIS — O24419 Gestational diabetes mellitus in pregnancy, unspecified control: Secondary | ICD-10-CM

## 2022-08-24 DIAGNOSIS — O99213 Obesity complicating pregnancy, third trimester: Secondary | ICD-10-CM

## 2022-08-24 DIAGNOSIS — O98513 Other viral diseases complicating pregnancy, third trimester: Secondary | ICD-10-CM

## 2022-08-24 DIAGNOSIS — E669 Obesity, unspecified: Secondary | ICD-10-CM

## 2022-08-24 DIAGNOSIS — Z3A33 33 weeks gestation of pregnancy: Secondary | ICD-10-CM

## 2022-08-28 ENCOUNTER — Ambulatory Visit (INDEPENDENT_AMBULATORY_CARE_PROVIDER_SITE_OTHER): Payer: Medicaid Other

## 2022-08-28 ENCOUNTER — Other Ambulatory Visit: Payer: Self-pay

## 2022-08-28 VITALS — BP 122/77 | HR 96 | Wt 280.6 lb

## 2022-08-28 DIAGNOSIS — O24410 Gestational diabetes mellitus in pregnancy, diet controlled: Secondary | ICD-10-CM

## 2022-08-28 DIAGNOSIS — O3663X0 Maternal care for excessive fetal growth, third trimester, not applicable or unspecified: Secondary | ICD-10-CM

## 2022-08-28 DIAGNOSIS — Z98891 History of uterine scar from previous surgery: Secondary | ICD-10-CM

## 2022-08-28 DIAGNOSIS — O09299 Supervision of pregnancy with other poor reproductive or obstetric history, unspecified trimester: Secondary | ICD-10-CM

## 2022-08-28 DIAGNOSIS — Z3A33 33 weeks gestation of pregnancy: Secondary | ICD-10-CM

## 2022-08-28 DIAGNOSIS — B009 Herpesviral infection, unspecified: Secondary | ICD-10-CM

## 2022-08-28 DIAGNOSIS — O98513 Other viral diseases complicating pregnancy, third trimester: Secondary | ICD-10-CM

## 2022-08-28 DIAGNOSIS — O0990 Supervision of high risk pregnancy, unspecified, unspecified trimester: Secondary | ICD-10-CM

## 2022-08-28 NOTE — Progress Notes (Signed)
PRENATAL VISIT NOTE  Subjective:  Joanne Huff is a 32 y.o. G3P2002 at [redacted]w[redacted]d being seen today for ongoing prenatal care.  She is currently monitored for the following issues for this high-risk pregnancy and has HSV-2 infection complicating pregnancy; Obesity in pregnancy; Hx of preeclampsia, prior pregnancy, currently pregnant; Hx of cesarean section; Supervision of high risk pregnancy, antepartum; BMI 38.0-38.9,adult; Excessive fetal growth affecting management of mother in second trimester, antepartum; and GDM (gestational diabetes mellitus) on their problem list.  Patient reports no complaints. She is here for CBG monitoring. Contractions: Irritability. Vag. Bleeding: None.  Movement: Present. Denies leaking of fluid.   The following portions of the patient's history were reviewed and updated as appropriate: allergies, current medications, past family history, past medical history, past social history, past surgical history and problem list.   Objective:   Vitals:   08/28/22 1451  BP: 122/77  Pulse: 96  Weight: 280 lb 9.6 oz (127.3 kg)    Fetal Status: Fetal Heart Rate (bpm): 155   Movement: Present     General:  Alert, oriented and cooperative. Patient is in no acute distress.  Skin: Skin is warm and dry. No rash noted.   Cardiovascular: Normal heart rate noted  Respiratory: Normal respiratory effort, no problems with respiration noted  Abdomen: Soft, gravid, appropriate for gestational age.  Pain/Pressure: Absent     Pelvic: Cervical exam deferred        Extremities: Normal range of motion.  Edema: None  Mental Status: Normal mood and affect. Normal behavior. Normal judgment and thought content.   Assessment and Plan:  Pregnancy: G3P2002 at [redacted]w[redacted]d 1. Supervision of high risk pregnancy, antepartum - Doing well, no concerns  2. [redacted] weeks gestation of pregnancy - Endorses active fetal movement  3. Diet controlled gestational diabetes mellitus (GDM) in third trimester -  Previously on Metformin however stopped after 1.5 weeks due to GI side effects. Reviewed log; fasting CBGs all within range. 2 hr postprandials ranging from 105-130. 7 out of 16 numbers elevated. Patient is able to recall eating poorly on elevated numbers. Numbers reviewed with Dr. Vergie Living. I discussed starting insulin vs strict diet control as numbers are not terrible, patient opts to continue with diet with the knowledge that if CBGs remain elevated next week, insulin may be started. Will also add NST and BPP to next weeks visit.  - Continued to review importance of glucose control and risks of uncontrolled diabetes on fetus  4. Hx of preeclampsia, prior pregnancy, currently pregnant - BP stable - Continue bASA  5. Hx of cesarean section - Desires TOLAC if possible but wants to wait until final growth [place] before making decisino  6. Herpes simplex virus type 2 (HSV-2) infection affecting pregnancy in third trimester - Suppression at 35/36 weeks  7. Excessive fetal growth affecting management of pregnancy in third trimester, single or unspecified fetus - 08/24/2022: 2661g, 95% EFW   Preterm labor symptoms and general obstetric precautions including but not limited to vaginal bleeding, contractions, leaking of fluid and fetal movement were reviewed in detail with the patient. Please refer to After Visit Summary for other counseling recommendations.   Return in about 1 week (around 09/04/2022).  Future Appointments  Date Time Provider Department Center  09/04/2022  1:15 PM Adam Phenix, MD [facility]  09/04/2022  3:15 PM [facility] NST [facility]  09/14/2022  2:15 PM [facility] NST [facility]  09/14/2022  3:15 PM [place] Bing, MD [facility]  09/19/2022  7:45 AM [facility] NURSE [facility]  09/19/2022  8:00 AM [facility] US1 [facility]    Brand Males, CNM

## 2022-08-31 ENCOUNTER — Other Ambulatory Visit: Payer: Medicaid Other

## 2022-08-31 ENCOUNTER — Ambulatory Visit: Payer: Medicaid Other

## 2022-09-04 ENCOUNTER — Other Ambulatory Visit: Payer: Self-pay | Admitting: Obstetrics

## 2022-09-04 ENCOUNTER — Ambulatory Visit (INDEPENDENT_AMBULATORY_CARE_PROVIDER_SITE_OTHER): Payer: Medicaid Other | Admitting: Obstetrics & Gynecology

## 2022-09-04 ENCOUNTER — Ambulatory Visit: Payer: Medicaid Other | Admitting: *Deleted

## 2022-09-04 ENCOUNTER — Other Ambulatory Visit: Payer: Self-pay

## 2022-09-04 VITALS — BP 134/68 | HR 79 | Wt 285.5 lb

## 2022-09-04 DIAGNOSIS — O99210 Obesity complicating pregnancy, unspecified trimester: Secondary | ICD-10-CM

## 2022-09-04 DIAGNOSIS — O09293 Supervision of pregnancy with other poor reproductive or obstetric history, third trimester: Secondary | ICD-10-CM

## 2022-09-04 DIAGNOSIS — O0990 Supervision of high risk pregnancy, unspecified, unspecified trimester: Secondary | ICD-10-CM

## 2022-09-04 DIAGNOSIS — Z3A34 34 weeks gestation of pregnancy: Secondary | ICD-10-CM

## 2022-09-04 DIAGNOSIS — O99213 Obesity complicating pregnancy, third trimester: Secondary | ICD-10-CM

## 2022-09-04 DIAGNOSIS — Z98891 History of uterine scar from previous surgery: Secondary | ICD-10-CM

## 2022-09-04 DIAGNOSIS — O09299 Supervision of pregnancy with other poor reproductive or obstetric history, unspecified trimester: Secondary | ICD-10-CM

## 2022-09-04 DIAGNOSIS — O24419 Gestational diabetes mellitus in pregnancy, unspecified control: Secondary | ICD-10-CM

## 2022-09-04 DIAGNOSIS — O0993 Supervision of high risk pregnancy, unspecified, third trimester: Secondary | ICD-10-CM

## 2022-09-04 DIAGNOSIS — O24410 Gestational diabetes mellitus in pregnancy, diet controlled: Secondary | ICD-10-CM

## 2022-09-04 NOTE — Progress Notes (Signed)
Per Dr. Debroah Loop, pt does not meet criteria for fetal testing due to reasonable blood sugar control on diet alone. NST/BPP not performed today and future appt on 5/30 was also canceled.

## 2022-09-04 NOTE — Progress Notes (Signed)
Pt states will send log today thru My Chart.

## 2022-09-04 NOTE — Progress Notes (Signed)
   PRENATAL VISIT NOTE  Subjective:  Joanne Huff is a 32 y.o. G3P2002 at [redacted]w[redacted]d being seen today for ongoing prenatal care.  She is currently monitored for the following issues for this high-risk pregnancy and has HSV-2 infection complicating pregnancy; Obesity in pregnancy; Hx of preeclampsia, prior pregnancy, currently pregnant; Hx of cesarean section; Supervision of high risk pregnancy, antepartum; BMI 38.0-38.9,adult; Excessive fetal growth affecting management of mother in second trimester, antepartum; and GDM (gestational diabetes mellitus) on their problem list.  Patient reports heartburn and mild .  Contractions: Not present. Vag. Bleeding: None.  Movement: Present. Denies leaking of fluid.   The following portions of the patient's history were reviewed and updated as appropriate: allergies, current medications, past family history, past medical history, past social history, past surgical history and problem list.   Objective:   Vitals:   09/04/22 1341  BP: 134/68  Pulse: 79  Weight: 285 lb 8 oz (129.5 kg)    Fetal Status: Fetal Heart Rate (bpm): 141   Movement: Present     General:  Alert, oriented and cooperative. Patient is in no acute distress.  Skin: Skin is warm and dry. No rash noted.   Cardiovascular: Normal heart rate noted  Respiratory: Normal respiratory effort, no problems with respiration noted  Abdomen: Soft, gravid, appropriate for gestational age.  Pain/Pressure: Absent     Pelvic: Cervical exam deferred        Extremities: Normal range of motion.  Edema: Trace  Mental Status: Normal mood and affect. Normal behavior. Normal judgment and thought content.   Assessment and Plan:  Pregnancy: G3P2002 at [redacted]w[redacted]d There are no diagnoses linked to this encounter. Preterm labor symptoms and general obstetric precautions including but not limited to vaginal bleeding, contractions, leaking of fluid and fetal movement were reviewed in detail with the patient. Please refer  to After Visit Summary for other counseling recommendations.  Diet controlled gestational diabetes mellitus (GDM) in third trimester  Hx of cesarean section  Supervision of high risk pregnancy, antepartum  Obesity in pregnancy  Hx of preeclampsia, prior pregnancy, currently pregnant  Return in about 1 week (around 09/11/2022). Diabetes is controlled with diet, urged to continue BG testing and RTC with readings weekly, no need for weekly fetal testing Future Appointments  Date Time Provider Department Center  09/04/2022  3:15 PM Day, Drucilla Schmidt RN [facility]  09/14/2022  3:15 PM [place] Bing, MD [facility]  09/19/2022  7:45 AM [facility] NURSE [facility]  09/19/2022  8:00 AM [facility] US1 [facility]    Scheryl Darter, MD

## 2022-09-07 ENCOUNTER — Other Ambulatory Visit: Payer: Medicaid Other

## 2022-09-14 ENCOUNTER — Other Ambulatory Visit (INDEPENDENT_AMBULATORY_CARE_PROVIDER_SITE_OTHER): Payer: Medicaid Other

## 2022-09-14 ENCOUNTER — Other Ambulatory Visit (HOSPITAL_COMMUNITY)
Admission: RE | Admit: 2022-09-14 | Discharge: 2022-09-14 | Disposition: A | Discharge status: Home / Self Care | Payer: Medicaid Other | Source: Ambulatory Visit | Attending: Obstetrics and Gynecology | Admitting: Obstetrics and Gynecology

## 2022-09-14 ENCOUNTER — Other Ambulatory Visit: Payer: Self-pay

## 2022-09-14 ENCOUNTER — Other Ambulatory Visit: Payer: Medicaid Other

## 2022-09-14 ENCOUNTER — Encounter: Payer: Medicaid Other | Admitting: Obstetrics and Gynecology

## 2022-09-14 ENCOUNTER — Ambulatory Visit (INDEPENDENT_AMBULATORY_CARE_PROVIDER_SITE_OTHER): Payer: Medicaid Other | Admitting: Obstetrics and Gynecology

## 2022-09-14 VITALS — BP 109/76 | HR 85 | Wt 280.0 lb

## 2022-09-14 DIAGNOSIS — B009 Herpesviral infection, unspecified: Secondary | ICD-10-CM

## 2022-09-14 DIAGNOSIS — O3663X0 Maternal care for excessive fetal growth, third trimester, not applicable or unspecified: Secondary | ICD-10-CM

## 2022-09-14 DIAGNOSIS — Z3A36 36 weeks gestation of pregnancy: Secondary | ICD-10-CM

## 2022-09-14 DIAGNOSIS — Z6838 Body mass index (BMI) 38.0-38.9, adult: Secondary | ICD-10-CM

## 2022-09-14 DIAGNOSIS — O09299 Supervision of pregnancy with other poor reproductive or obstetric history, unspecified trimester: Secondary | ICD-10-CM

## 2022-09-14 DIAGNOSIS — O98513 Other viral diseases complicating pregnancy, third trimester: Secondary | ICD-10-CM

## 2022-09-14 DIAGNOSIS — O99210 Obesity complicating pregnancy, unspecified trimester: Secondary | ICD-10-CM

## 2022-09-14 DIAGNOSIS — O24410 Gestational diabetes mellitus in pregnancy, diet controlled: Secondary | ICD-10-CM

## 2022-09-14 DIAGNOSIS — O0990 Supervision of high risk pregnancy, unspecified, unspecified trimester: Secondary | ICD-10-CM | POA: Insufficient documentation

## 2022-09-14 DIAGNOSIS — O0993 Supervision of high risk pregnancy, unspecified, third trimester: Secondary | ICD-10-CM

## 2022-09-14 DIAGNOSIS — O99213 Obesity complicating pregnancy, third trimester: Secondary | ICD-10-CM

## 2022-09-14 DIAGNOSIS — Z98891 History of uterine scar from previous surgery: Secondary | ICD-10-CM

## 2022-09-14 DIAGNOSIS — O09293 Supervision of pregnancy with other poor reproductive or obstetric history, third trimester: Secondary | ICD-10-CM

## 2022-09-14 DIAGNOSIS — O3662X0 Maternal care for excessive fetal growth, second trimester, not applicable or unspecified: Secondary | ICD-10-CM

## 2022-09-14 MED ORDER — SENNA 8.6 MG PO TABS: 1.0000 | ORAL_TABLET | Freq: Every day | ORAL | 0 refills | Status: DC | PRN

## 2022-09-14 MED ORDER — DOCUSATE SODIUM 100 MG PO CAPS: 100.0000 mg | ORAL_CAPSULE | Freq: Two times a day (BID) | ORAL | 0 refills | Status: DC

## 2022-09-14 MED ORDER — VALACYCLOVIR HCL 500 MG PO TABS: 500.0000 mg | ORAL_TABLET | Freq: Two times a day (BID) | ORAL | 1 refills | Status: AC

## 2022-09-14 NOTE — Progress Notes (Signed)
Pt informed that the ultrasound is considered a limited OB ultrasound and is not intended to be a complete ultrasound exam.  Patient also informed that the ultrasound is not being completed with the intent of assessing for fetal or placental anomalies or any pelvic abnormalities.  Explained that the purpose of today's ultrasound is to assess for  BPP, presentation, and AFI.  Patient acknowledges the purpose of the exam and the limitations of the study.     Tambi Thole H RN BSN 09/14/22  

## 2022-09-14 NOTE — Progress Notes (Signed)
     PRENATAL VISIT NOTE  Subjective:  Joanne Huff is a 32 y.o. G3P2002 at [redacted]w[redacted]d being seen today for ongoing prenatal care.  She is currently monitored for the following issues for this high-risk pregnancy and has HSV-2 infection complicating pregnancy; Obesity in pregnancy; Hx of preeclampsia, prior pregnancy, currently pregnant; Hx of cesarean section; Supervision of high risk pregnancy, antepartum; BMI 38.0-38.9,adult; Excessive fetal growth affecting management of mother in second trimester, antepartum; and GDM (gestational diabetes mellitus) on their problem list.  Patient reports no complaints.  Contractions: Irritability. Vag. Bleeding: None.  Movement: Present. Denies leaking of fluid.   The following portions of the patient's history were reviewed and updated as appropriate: allergies, current medications, past family history, past medical history, past social history, past surgical history and problem list.   Objective:   Vitals:   09/14/22 1100  BP: 109/76  Pulse: 85  Weight: 280 lb (127 kg)    Fetal Status: Fetal Heart Rate (bpm): 156   Movement: Present     General:  Alert, oriented and cooperative. Patient is in no acute distress.  Skin: Skin is warm and dry. No rash noted.   Cardiovascular: Normal heart rate noted  Respiratory: Normal respiratory effort, no problems with respiration noted  Abdomen: Soft, gravid, appropriate for gestational age.  Pain/Pressure: Present     Pelvic: Cervical exam performed in the presence of a chaperone Dilation: Closed Effacement (%): 50 Station: Ballotable  Extremities: Normal range of motion.  Edema: Trace  Mental Status: Normal mood and affect. Normal behavior. Normal judgment and thought content.   Assessment and Plan:  Pregnancy: G3P2002 at [redacted]w[redacted]d 1. Supervision of high risk pregnancy, antepartum - GC/Chlamydia probe amp ([place])not at [facility] - Culture, beta strep (group b only)  2. Diet controlled gestational diabetes  mellitus (GDM) in third trimester A few lunch and dinner in the  low 120s to max 130. Okay to stay diet controlled. Pt previously LGA and has rpt growth on 6/4. I d/w her that with GDM, even though sugar log looks okay, that with LGA we recommend 37wk delivery, which she is amenable to. Next best date is 6/4 at 1145pm. If sugars still okay and now not LGA next week, can change IOL date.  Bpp 8/10 (-2 breathing) - US FETAL BPP W/NONSTRESS; Future  3. Obesity in pregnancy  4. BMI 38.0-38.9,adult  5. Hx of cesarean section [place] consent already signed   6. Herpes simplex virus type 2 (HSV-2) infection affecting pregnancy in third trimester Ppx sent in today  7. Excessive fetal growth affecting management of pregnancy in second trimester, single or unspecified fetus See above  8. Hx of preeclampsia, prior pregnancy, currently pregnant Contiue low dose asa  Preterm labor symptoms and general obstetric precautions including but not limited to vaginal bleeding, contractions, leaking of fluid and fetal movement were reviewed in detail with the patient. Please refer to After Visit Summary for other counseling recommendations.   Return in 5 days (on 09/19/2022) for md visit, high risk ob, in person.  Future Appointments  Date Time Provider Department Center  09/19/2022  7:45 AM [facility] NURSE [facility]  09/19/2022  8:00 AM [facility] US1 [facility]  09/20/2022 12:00 AM [facility] SCHED ROOM [facility] None    Everson Bing, MD

## 2022-09-15 ENCOUNTER — Encounter (HOSPITAL_COMMUNITY): Payer: Self-pay

## 2022-09-15 ENCOUNTER — Other Ambulatory Visit: Payer: Self-pay | Admitting: Obstetrics and Gynecology

## 2022-09-15 ENCOUNTER — Telehealth (HOSPITAL_COMMUNITY): Payer: Self-pay | Admitting: *Deleted

## 2022-09-15 DIAGNOSIS — Z3490 Encounter for supervision of normal pregnancy, unspecified, unspecified trimester: Secondary | ICD-10-CM

## 2022-09-15 LAB — GC/CHLAMYDIA PROBE AMP (~~LOC~~) NOT AT ARMC
Chlamydia: NEGATIVE
Comment: NEGATIVE
Comment: NORMAL
Neisseria Gonorrhea: NEGATIVE

## 2022-09-15 NOTE — Telephone Encounter (Signed)
Preadmission screen  

## 2022-09-17 ENCOUNTER — Other Ambulatory Visit: Payer: Self-pay | Admitting: Advanced Practice Midwife

## 2022-09-18 LAB — CULTURE, BETA STREP (GROUP B ONLY): Strep Gp B Culture: NEGATIVE

## 2022-09-19 ENCOUNTER — Ambulatory Visit (HOSPITAL_BASED_OUTPATIENT_CLINIC_OR_DEPARTMENT_OTHER): Payer: Medicaid Other

## 2022-09-19 ENCOUNTER — Other Ambulatory Visit: Payer: Self-pay

## 2022-09-19 ENCOUNTER — Inpatient Hospital Stay (HOSPITAL_COMMUNITY)
Admission: AD | Admit: 2022-09-19 | Discharge: 2022-09-21 | DRG: 806 | Disposition: A | Discharge status: Home / Self Care | Payer: Medicaid Other | Attending: Obstetrics and Gynecology | Admitting: Obstetrics and Gynecology

## 2022-09-19 ENCOUNTER — Ambulatory Visit: Payer: Medicaid Other | Admitting: *Deleted

## 2022-09-19 ENCOUNTER — Encounter (HOSPITAL_COMMUNITY): Payer: Self-pay | Admitting: Obstetrics and Gynecology

## 2022-09-19 VITALS — BP 145/79 | HR 79

## 2022-09-19 DIAGNOSIS — O99213 Obesity complicating pregnancy, third trimester: Secondary | ICD-10-CM | POA: Insufficient documentation

## 2022-09-19 DIAGNOSIS — Z87891 Personal history of nicotine dependence: Secondary | ICD-10-CM | POA: Diagnosis not present

## 2022-09-19 DIAGNOSIS — O99214 Obesity complicating childbirth: Secondary | ICD-10-CM | POA: Diagnosis present

## 2022-09-19 DIAGNOSIS — O134 Gestational [pregnancy-induced] hypertension without significant proteinuria, complicating childbirth: Secondary | ICD-10-CM | POA: Diagnosis present

## 2022-09-19 DIAGNOSIS — Z3A36 36 weeks gestation of pregnancy: Secondary | ICD-10-CM

## 2022-09-19 DIAGNOSIS — O34219 Maternal care for unspecified type scar from previous cesarean delivery: Principal | ICD-10-CM

## 2022-09-19 DIAGNOSIS — Z7982 Long term (current) use of aspirin: Secondary | ICD-10-CM | POA: Diagnosis not present

## 2022-09-19 DIAGNOSIS — O24419 Gestational diabetes mellitus in pregnancy, unspecified control: Secondary | ICD-10-CM | POA: Diagnosis present

## 2022-09-19 DIAGNOSIS — O24420 Gestational diabetes mellitus in childbirth, diet controlled: Secondary | ICD-10-CM | POA: Diagnosis present

## 2022-09-19 DIAGNOSIS — O3663X0 Maternal care for excessive fetal growth, third trimester, not applicable or unspecified: Secondary | ICD-10-CM

## 2022-09-19 DIAGNOSIS — O9832 Other infections with a predominantly sexual mode of transmission complicating childbirth: Secondary | ICD-10-CM | POA: Diagnosis present

## 2022-09-19 DIAGNOSIS — B009 Herpesviral infection, unspecified: Secondary | ICD-10-CM | POA: Diagnosis not present

## 2022-09-19 DIAGNOSIS — O98513 Other viral diseases complicating pregnancy, third trimester: Secondary | ICD-10-CM

## 2022-09-19 DIAGNOSIS — O24410 Gestational diabetes mellitus in pregnancy, diet controlled: Secondary | ICD-10-CM

## 2022-09-19 DIAGNOSIS — O9872 Human immunodeficiency virus [HIV] disease complicating childbirth: Secondary | ICD-10-CM | POA: Diagnosis not present

## 2022-09-19 DIAGNOSIS — O34211 Maternal care for low transverse scar from previous cesarean delivery: Secondary | ICD-10-CM | POA: Diagnosis not present

## 2022-09-19 DIAGNOSIS — A6000 Herpesviral infection of urogenital system, unspecified: Secondary | ICD-10-CM | POA: Diagnosis present

## 2022-09-19 DIAGNOSIS — O133 Gestational [pregnancy-induced] hypertension without significant proteinuria, third trimester: Secondary | ICD-10-CM | POA: Insufficient documentation

## 2022-09-19 DIAGNOSIS — Z3490 Encounter for supervision of normal pregnancy, unspecified, unspecified trimester: Secondary | ICD-10-CM

## 2022-09-19 DIAGNOSIS — E669 Obesity, unspecified: Secondary | ICD-10-CM

## 2022-09-19 LAB — CBC
HCT: 31.1 % — ABNORMAL LOW (ref 36.0–46.0)
Hemoglobin: 10.3 g/dL — ABNORMAL LOW (ref 12.0–15.0)
MCH: 29.3 pg (ref 26.0–34.0)
MCHC: 33.1 g/dL (ref 30.0–36.0)
MCV: 88.4 fL (ref 80.0–100.0)
Platelets: 287 10*3/uL (ref 150–400)
RBC: 3.52 MIL/uL — ABNORMAL LOW (ref 3.87–5.11)
RDW: 14.1 % (ref 11.5–15.5)
WBC: 6.1 10*3/uL (ref 4.0–10.5)
nRBC: 0 % (ref 0.0–0.2)

## 2022-09-19 LAB — COMPREHENSIVE METABOLIC PANEL
ALT: 17 U/L (ref 0–44)
AST: 17 U/L (ref 15–41)
Albumin: 2.6 g/dL — ABNORMAL LOW (ref 3.5–5.0)
Alkaline Phosphatase: 70 U/L (ref 38–126)
Anion gap: 10 (ref 5–15)
BUN: 6 mg/dL (ref 6–20)
CO2: 21 mmol/L — ABNORMAL LOW (ref 22–32)
Calcium: 8.7 mg/dL — ABNORMAL LOW (ref 8.9–10.3)
Chloride: 105 mmol/L (ref 98–111)
Creatinine, Ser: 0.54 mg/dL (ref 0.44–1.00)
GFR, Estimated: >60 mL/min (ref >60)
Glucose, Bld: 81 mg/dL (ref 70–99)
Potassium: 3.6 mmol/L (ref 3.5–5.1)
Sodium: 136 mmol/L (ref 135–145)
Total Bilirubin: 0.4 mg/dL (ref 0.3–1.2)
Total Protein: 6 g/dL — ABNORMAL LOW (ref 6.5–8.1)

## 2022-09-19 LAB — RPR: RPR Ser Ql: NONREACTIVE

## 2022-09-19 LAB — GLUCOSE, CAPILLARY
Glucose-Capillary: 85 mg/dL (ref 70–99)
Glucose-Capillary: 103 mg/dL — ABNORMAL HIGH (ref 70–99)
Glucose-Capillary: 64 mg/dL — ABNORMAL LOW (ref 70–99)
Glucose-Capillary: 77 mg/dL (ref 70–99)

## 2022-09-19 LAB — TYPE AND SCREEN
ABO/RH(D): A POS
Antibody Screen: NEGATIVE

## 2022-09-19 LAB — PROTEIN / CREATININE RATIO, URINE
Creatinine, Urine: 138 mg/dL
Protein Creatinine Ratio: 0.13 mg/mg{Cre} (ref 0.00–0.15)
Total Protein, Urine: 18 mg/dL

## 2022-09-19 MED ORDER — OXYTOCIN BOLUS FROM INFUSION
333.0000 mL | Freq: Once | INTRAVENOUS | Status: AC
  Administered 2022-09-20: 333 mL via INTRAVENOUS

## 2022-09-19 MED ORDER — SOD CITRATE-CITRIC ACID 500-334 MG/5ML PO SOLN: 30.0000 mL | ORAL | Status: DC | PRN

## 2022-09-19 MED ORDER — OXYTOCIN-SODIUM CHLORIDE 30-0.9 UT/500ML-% IV SOLN
1.0000 m[IU]/min | INTRAVENOUS | Status: DC
  Administered 2022-09-19: 2 m[IU]/min via INTRAVENOUS
  Filled 2022-09-19 (×2): qty 500

## 2022-09-19 MED ORDER — TERBUTALINE SULFATE 1 MG/ML IJ SOLN: 0.2500 mg | Freq: Once | INTRAMUSCULAR | Status: DC | PRN

## 2022-09-19 MED ORDER — ACETAMINOPHEN 325 MG PO TABS: 650.0000 mg | ORAL_TABLET | ORAL | Status: DC | PRN

## 2022-09-19 MED ORDER — LACTATED RINGERS IV SOLN: 500.0000 mL | INTRAVENOUS | Status: DC | PRN

## 2022-09-19 MED ORDER — LIDOCAINE HCL (PF) 1 % IJ SOLN: 30.0000 mL | INTRAMUSCULAR | Status: DC | PRN

## 2022-09-19 MED ORDER — OXYTOCIN-SODIUM CHLORIDE 30-0.9 UT/500ML-% IV SOLN
2.5000 [IU]/h | INTRAVENOUS | Status: DC
  Administered 2022-09-20: 2.5 [IU]/h via INTRAVENOUS

## 2022-09-19 MED ORDER — FENTANYL CITRATE (PF) 100 MCG/2ML IJ SOLN
50.0000 ug | INTRAMUSCULAR | Status: AC | PRN
  Administered 2022-09-19 – 2022-09-20 (×2): 50 ug via INTRAVENOUS
  Filled 2022-09-19 (×2): qty 2

## 2022-09-19 MED ORDER — LACTATED RINGERS IV SOLN: INTRAVENOUS | Status: DC

## 2022-09-19 MED ORDER — ONDANSETRON HCL 4 MG/2ML IJ SOLN: 4.0000 mg | Freq: Four times a day (QID) | INTRAMUSCULAR | Status: DC | PRN

## 2022-09-19 NOTE — Progress Notes (Signed)
Patient ID: Joanne Huff, female   DOB: April 27, 1990, 32 y.o.   MRN: [id number] LABOR NOTE Joanne Huff is a 32 y.o. G3P2002 at [redacted]w[redacted]d admitted for induction of labor due to BPP 6/8, diabetes mellitus A1DM, and gestational hypertension   Subjective: Contractions a little uncomfortable.  Denies ha, visual changes, ruq/epigastric pain, n/v.    Objective: BP (!) 146/82   Pulse 69   Temp 98.1 F (36.7 C) (Oral)   Resp 17   LMP 12/23/2021  No intake/output data recorded.  FHR baseline 120 bpm, Variability: moderate, Accelerations:present, Decelerations:  Absent Toco: q 5-7 mins   FB now out, RN unable to reach cx Vtx by u/s at last check  Pitocin @ 6 mu/min  Labs: Lab Results  Component Value Date   WBC 6.1 09/19/2022   HGB 10.3 (L) 09/19/2022   HCT 31.1 (L) 09/19/2022   MCV 88.4 09/19/2022   PLT 287 09/19/2022    Assessment / Plan: IOL d/t BPP 6/8, A1DM, GHTN, foley bulb now out, will continue to increase pitocin per protcol.   Labor: cervical ripening phase Fetal Wellbeing:  Category I Pain Control:  labor support without medications Pre-eclampsia: asymptomatic, bp's stable, and labs stable I/D:  GBS neg Anticipated MOD: VBAC  Cheral Marker CNM, WHNP-BC 09/19/2022, 11:44 PM

## 2022-09-19 NOTE — H&P (Signed)
OBSTETRIC ADMISSION HISTORY AND PHYSICAL  Joanne Huff is a 32 y.o. female G49P2002 with IUP at [redacted]w[redacted]d by ultrasound direct admit for IOL for GDM and elevated blood pressure. She reports +FMs, No LOF, no VB, no blurry vision, headaches or peripheral edema, and RUQ pain.  She plans on breast and bottle feeding. She requests tubal ligation for birth control. She received her prenatal [facility]   Dating: By Ultrasound --->  Estimated Date of Delivery: 10/11/22  Sono:    @[redacted]w[redacted]d , CWD, normal anatomy, ceohalic presentation,  3687g, 97% EFW   Prenatal History/Complications:  GDMA1 Obesity (BMI 37) LGA fetus  HSV type 2  Excessive fetal growth New onset elevated BP   Past Medical History: Past Medical History:  Diagnosis Date   Chlamydia    Headache    High risk HPV infection    HSV-1 infection    Marijuana use    Ovarian cyst    Pregnancy induced hypertension    Smoker    Trichimoniasis    Vaginal Pap smear, abnormal     Past Surgical History: Past Surgical History:  Procedure Laterality Date   CESAREAN SECTION N/A 08/20/2018   Procedure: CESAREAN SECTION;  Surgeon: Levie Heritage, DO;  Location: [facility];  Service: Obstetrics;  Laterality: N/A;   MOUTH SURGERY     2 teeth extracted    Obstetrical History: OB History     Gravida  3   Para  2   Term  2   Preterm  0   AB  0   Living  2      SAB  0   IAB  0   Ectopic  0   Multiple  0   Live Births  2           Social History Social History   Socioeconomic History   Marital status: Single    Spouse name: Not on file   Number of children: Not on file   Years of education: Not on file   Highest education level: Not on file  Occupational History   Not on file  Tobacco Use   Smoking status: Former    Packs/day: 0.50    Years: 13.00    Additional pack years: 0.00    Total pack years: 6.50    Types: Cigarettes   Smokeless tobacco: Never   Tobacco comments:    Quit with preg  Vaping  Use   Vaping Use: Never used  Substance and Sexual Activity   Alcohol use: Not Currently    Comment: rare/social, none now   Drug use: Not Currently    Types: Marijuana    Comment: none 2022   Sexual activity: Yes    Birth control/protection: None  Other Topics Concern   Not on file  Social History Narrative   Not on file   Social Determinants of Health   Financial Resource Strain: Low Risk  (08/14/2018)   Overall Financial Resource Strain (CARDIA)    Difficulty of Paying Living Expenses: Not hard at all  Food Insecurity: Food Insecurity Present (07/31/2022)   Hunger Vital Sign    Worried About Running Out of Food in the Last Year: Sometimes true    Ran Out of Food in the Last Year: Sometimes true  Transportation Needs: No Transportation Needs (07/31/2022)   PRAPARE - Administrator, Civil Service (Medical): No    Lack of Transportation (Non-Medical): No  Physical Activity: Not on file  Stress: No Stress Concern Present (08/14/2018)   [employer] of Occupational Health - Occupational Stress Questionnaire    Feeling of Stress : Only a little  Social Connections: Not on file    Family History: Family History  Problem Relation Age of Onset   Cancer Mother    Hypertension Mother    Stroke Mother    Arthritis Father    Hypertension Father    Hypertension Maternal Grandmother    Diabetes Maternal Grandmother    Diabetes Paternal Grandmother     Allergies: No Known Allergies  Pt denies allergies to latex, iodine, or shellfish.  Medications Prior to Admission  Medication Sig Dispense Refill Last Dose   Accu-Chek Softclix Lancets lancets Use as instructed 100 each 12    aspirin EC 81 MG tablet Take 1 tablet (81 mg total) by mouth daily. Swallow whole. 30 tablet 12    Blood Glucose Monitoring Suppl (ACCU-CHEK GUIDE) w/Device KIT 1 Device by Does not apply route 4 (four) times daily. 1 kit 0    Blood Pressure Monitoring (BLOOD PRESSURE KIT) DEVI 1 Device by  Does not apply route as needed. 1 each 0    docusate sodium (COLACE) 100 MG capsule Take 1 capsule (100 mg total) by mouth 2 (two) times daily. 30 capsule 0    glucose blood (ACCU-CHEK GUIDE) test strip Use to check blood sugars four times a day was instructed 50 each 12    Misc. Devices (GOJJI WEIGHT SCALE) MISC 1 Device by Does not apply route as needed. 1 each 0    Prenatal Vit-Fe Fumarate-FA (PRENATAL PO) Take by mouth. (Patient not taking: Reported on 09/19/2022)      senna (SENOKOT) 8.6 MG TABS tablet Take 1 tablet (8.6 mg total) by mouth daily as needed for mild constipation. 5 tablet 0    valACYclovir (VALTREX) 500 MG tablet Take 1 tablet (500 mg total) by mouth 2 (two) times daily. 30 tablet 1      Review of Systems   All systems reviewed and negative except as stated in HPI  Last menstrual period 12/23/2021, unknown if currently breastfeeding. General appearance: alert, cooperative, and no distress Lungs: clear to auscultation bilaterally Heart: regular rate and rhythm Abdomen: soft, non-tender; bowel sounds normal Extremities: Homans sign is negative, no sign of DVT Presentation: cephalic Fetal monitoringBaseline: 120 bpm, Variability: Good {> 6 bpm), Accelerations: Reactive, and Decelerations: Absent Uterine activityFrequency: 1 time per hour     Prenatal labs: ABO, Rh: A/Positive/-- (12/28 1425) Antibody: Negative (12/28 1425) Rubella: 6.66 (12/28 1425) RPR: Non Reactive (03/06 0918)  HBsAg: Negative (12/28 1425)  HIV: Non Reactive (03/06 0918)  GBS: Negative/-- (05/30 1300)  1 hr Glucola: 142 Genetic screening:  Horizon/Panorama negative Anatomy US: No abnormalities   Prenatal Transfer Tool  Maternal Diabetes: Yes:  Diabetes Type:  Diet controlled Genetic Screening: Normal Maternal Ultrasounds/Referrals: Normal Fetal Ultrasounds or other Referrals:  None Maternal Substance Abuse:  No Significant Maternal Medications:  None Significant Maternal Lab Results:  Group B Strep negative  No results found for this or any previous visit (from the past 24 hour(s)).  Patient Active Problem List   Diagnosis Date Noted   BMI 38.0-38.9,adult 05/25/2022   Excessive fetal growth affecting management of mother in second trimester, antepartum 05/25/2022   GDM (gestational diabetes mellitus) 05/25/2022   Supervision of high risk pregnancy, antepartum 04/13/2022   Hx of cesarean section 08/20/2018   Hx of preeclampsia, prior pregnancy, currently pregnant 09/01/2014   Obesity in  pregnancy    HSV-2 infection complicating pregnancy     Assessment/Plan:  Joanne Huff is a 32 y.o. G3P2002 at [redacted]w[redacted]d here for IOL for GDM.  #Labor: IOL  IV pitocin started, will attempt FB at next check #Pain: IV pain meds as needed, epidural when desired #FWB: Cat 1 #ID:  HSV 2  Prophylaxis at 36 weeks  #MOF:  breast #MOC: Tubal ligation #Constipation:   Gigi Gin, Student-PA  Center for [employer], [facility] 09/19/2022, 9:38 AM  Resident Attestation  I saw and evaluated the patient, performing the key elements of the service.I  personally performed or re-performed the history, physical exam, and medical decision making activities of this service and have verified that the service and findings are accurately documented in the student's note. I developed the management plan that is described in the student's note, and I agree with the content, with my edits above.    Derrel Nip, MD

## 2022-09-19 NOTE — Progress Notes (Signed)
Patient ID: Joanne Huff, female   DOB: 06-24-1990, 32 y.o.   MRN: [id number] LABOR NOTE LORESE GIROUARD is a 32 y.o. G3P2002 at [redacted]w[redacted]d admitted for induction of labor due to BPP 6/8, diabetes mellitus A1DM, and gestational hypertension   Subjective: Contractions beginning to get uncomfortable. Denies ha, visual changes, ruq/epigastric pain, n/v.    Objective: BP (!) 143/94   Pulse 76   Temp 98.1 F (36.7 C) (Oral)   Resp 17   LMP 12/23/2021  No intake/output data recorded.  FHR baseline 135 bpm, Variability: moderate, Accelerations:present, Decelerations:  Absent Toco: q 3-5 mins   SVE:  outer os 2, unable to reach inner os or presenting part. Vtx confirmed by bs u/s Pitocin @ 14 mu/min  Cervical foley bulb inserted and inflated w/ 60ml LR w/ spec and ring forceps  Labs: Lab Results  Component Value Date   WBC 6.1 09/19/2022   HGB 10.3 (L) 09/19/2022   HCT 31.1 (L) 09/19/2022   MCV 88.4 09/19/2022   PLT 287 09/19/2022   CBG (last 3)  Recent Labs    09/19/22 1457 09/19/22 1522 09/19/22 2037  GLUCAP 64* 103* 77    Assessment / Plan: IOL d/t BPP 6/8, A1DM and GHTN, cervical foley bulb now in, turn pit to 30mu/min and remain there while balloon in. Once balloon out will continue increasing pit per protocol.   Labor: cervical ripening phase Fetal Wellbeing:  Category I Pain Control:  labor support without medications Pre-eclampsia: asymptomatic, bp's stable, and P:C ratio pending I/D:  GBS neg Anticipated MOD: VBAC  Cheral Marker CNM, WHNP-BC 09/19/2022, 9:14 PM

## 2022-09-20 ENCOUNTER — Encounter (HOSPITAL_COMMUNITY): Payer: Self-pay | Admitting: Obstetrics and Gynecology

## 2022-09-20 ENCOUNTER — Inpatient Hospital Stay (HOSPITAL_COMMUNITY): Payer: Medicaid Other | Admitting: Anesthesiology

## 2022-09-20 ENCOUNTER — Inpatient Hospital Stay (HOSPITAL_COMMUNITY): Payer: Medicaid Other

## 2022-09-20 DIAGNOSIS — O99214 Obesity complicating childbirth: Secondary | ICD-10-CM

## 2022-09-20 DIAGNOSIS — O133 Gestational [pregnancy-induced] hypertension without significant proteinuria, third trimester: Secondary | ICD-10-CM | POA: Insufficient documentation

## 2022-09-20 DIAGNOSIS — O34211 Maternal care for low transverse scar from previous cesarean delivery: Secondary | ICD-10-CM

## 2022-09-20 DIAGNOSIS — O9872 Human immunodeficiency virus [HIV] disease complicating childbirth: Secondary | ICD-10-CM

## 2022-09-20 DIAGNOSIS — O134 Gestational [pregnancy-induced] hypertension without significant proteinuria, complicating childbirth: Secondary | ICD-10-CM

## 2022-09-20 DIAGNOSIS — Z3A36 36 weeks gestation of pregnancy: Secondary | ICD-10-CM

## 2022-09-20 DIAGNOSIS — O34219 Maternal care for unspecified type scar from previous cesarean delivery: Principal | ICD-10-CM

## 2022-09-20 DIAGNOSIS — O3663X0 Maternal care for excessive fetal growth, third trimester, not applicable or unspecified: Secondary | ICD-10-CM

## 2022-09-20 DIAGNOSIS — O24420 Gestational diabetes mellitus in childbirth, diet controlled: Secondary | ICD-10-CM

## 2022-09-20 LAB — CBC
HCT: 29.9 % — ABNORMAL LOW (ref 36.0–46.0)
Hemoglobin: 9.8 g/dL — ABNORMAL LOW (ref 12.0–15.0)
MCH: 28.7 pg (ref 26.0–34.0)
MCHC: 32.8 g/dL (ref 30.0–36.0)
MCV: 87.4 fL (ref 80.0–100.0)
Platelets: 269 10*3/uL (ref 150–400)
RBC: 3.42 MIL/uL — ABNORMAL LOW (ref 3.87–5.11)
RDW: 14 % (ref 11.5–15.5)
WBC: 7.8 10*3/uL (ref 4.0–10.5)
nRBC: 0 % (ref 0.0–0.2)

## 2022-09-20 LAB — GLUCOSE, CAPILLARY
Glucose-Capillary: 61 mg/dL — ABNORMAL LOW (ref 70–99)
Glucose-Capillary: 70 mg/dL (ref 70–99)
Glucose-Capillary: 75 mg/dL (ref 70–99)
Glucose-Capillary: 77 mg/dL (ref 70–99)
Glucose-Capillary: 84 mg/dL (ref 70–99)

## 2022-09-20 MED ORDER — DIPHENHYDRAMINE HCL 50 MG/ML IJ SOLN
12.5000 mg | INTRAMUSCULAR | Status: DC | PRN
Start: 2022-09-20 — End: 2022-09-20

## 2022-09-20 MED ORDER — ZOLPIDEM TARTRATE 5 MG PO TABS: 5.0000 mg | ORAL_TABLET | Freq: Every evening | ORAL | Status: DC | PRN

## 2022-09-20 MED ORDER — ACETAMINOPHEN 325 MG PO TABS
650.0000 mg | ORAL_TABLET | ORAL | Status: DC | PRN
  Administered 2022-09-21: 650 mg via ORAL
  Filled 2022-09-20: qty 2

## 2022-09-20 MED ORDER — EPHEDRINE 5 MG/ML INJ: 10.0000 mg | INTRAVENOUS | Status: DC | PRN

## 2022-09-20 MED ORDER — FENTANYL-BUPIVACAINE-NACL 0.5-0.125-0.9 MG/250ML-% EP SOLN
12.0000 mL/h | EPIDURAL | Status: DC | PRN
  Administered 2022-09-20: 12 mL/h via EPIDURAL
  Filled 2022-09-20: qty 250

## 2022-09-20 MED ORDER — PHENYLEPHRINE 80 MCG/ML (10ML) SYRINGE FOR IV PUSH (FOR BLOOD PRESSURE SUPPORT): 80.0000 ug | PREFILLED_SYRINGE | INTRAVENOUS | Status: DC | PRN

## 2022-09-20 MED ORDER — ONDANSETRON HCL 4 MG PO TABS: 4.0000 mg | ORAL_TABLET | ORAL | Status: DC | PRN

## 2022-09-20 MED ORDER — DIPHENHYDRAMINE HCL 25 MG PO CAPS: 25.0000 mg | ORAL_CAPSULE | Freq: Four times a day (QID) | ORAL | Status: DC | PRN

## 2022-09-20 MED ORDER — PRENATAL MULTIVITAMIN CH
1.0000 | ORAL_TABLET | Freq: Every day | ORAL | Status: DC
  Administered 2022-09-21: 1 via ORAL
  Filled 2022-09-20: qty 1

## 2022-09-20 MED ORDER — LACTATED RINGERS IV SOLN
500.0000 mL | Freq: Once | INTRAVENOUS | Status: AC
  Administered 2022-09-20: 500 mL via INTRAVENOUS

## 2022-09-20 MED ORDER — ONDANSETRON HCL 4 MG/2ML IJ SOLN: 4.0000 mg | INTRAMUSCULAR | Status: DC | PRN

## 2022-09-20 MED ORDER — BENZOCAINE-MENTHOL 20-0.5 % EX AERO: 1.0000 | INHALATION_SPRAY | CUTANEOUS | Status: DC | PRN

## 2022-09-20 MED ORDER — FUROSEMIDE 20 MG PO TABS
20.0000 mg | ORAL_TABLET | Freq: Every day | ORAL | Status: DC
  Administered 2022-09-20 – 2022-09-21 (×2): 20 mg via ORAL
  Filled 2022-09-20 (×2): qty 1

## 2022-09-20 MED ORDER — DIBUCAINE (PERIANAL) 1 % EX OINT: 1.0000 | TOPICAL_OINTMENT | CUTANEOUS | Status: DC | PRN

## 2022-09-20 MED ORDER — DIPHENHYDRAMINE HCL 50 MG/ML IJ SOLN: 12.5000 mg | INTRAMUSCULAR | Status: DC | PRN

## 2022-09-20 MED ORDER — FENTANYL-BUPIVACAINE-NACL 0.5-0.125-0.9 MG/250ML-% EP SOLN: 12.0000 mL/h | EPIDURAL | Status: DC | PRN

## 2022-09-20 MED ORDER — SENNOSIDES-DOCUSATE SODIUM 8.6-50 MG PO TABS
2.0000 | ORAL_TABLET | Freq: Every day | ORAL | Status: DC
  Administered 2022-09-20 – 2022-09-21 (×2): 2 via ORAL
  Filled 2022-09-20 (×2): qty 2

## 2022-09-20 MED ORDER — LIDOCAINE HCL (PF) 1 % IJ SOLN
INTRAMUSCULAR | Status: DC | PRN
  Administered 2022-09-20: 8 mL via EPIDURAL

## 2022-09-20 MED ORDER — COCONUT OIL OIL: 1.0000 | TOPICAL_OIL | Status: DC | PRN

## 2022-09-20 MED ORDER — MISOPROSTOL 200 MCG PO TABS
ORAL_TABLET | ORAL | Status: AC
  Filled 2022-09-20: qty 3

## 2022-09-20 MED ORDER — SIMETHICONE 80 MG PO CHEW: 80.0000 mg | CHEWABLE_TABLET | ORAL | Status: DC | PRN

## 2022-09-20 MED ORDER — WITCH HAZEL-GLYCERIN EX PADS: 1.0000 | MEDICATED_PAD | CUTANEOUS | Status: DC | PRN

## 2022-09-20 MED ORDER — TETANUS-DIPHTH-ACELL PERTUSSIS 5-2.5-18.5 LF-MCG/0.5 IM SUSY: 0.5000 mL | PREFILLED_SYRINGE | Freq: Once | INTRAMUSCULAR | Status: DC

## 2022-09-20 MED ORDER — IBUPROFEN 600 MG PO TABS
600.0000 mg | ORAL_TABLET | Freq: Four times a day (QID) | ORAL | Status: DC
  Administered 2022-09-20 – 2022-09-21 (×4): 600 mg via ORAL
  Filled 2022-09-20 (×4): qty 1

## 2022-09-20 MED ORDER — MISOPROSTOL 200 MCG PO TABS
600.0000 ug | ORAL_TABLET | Freq: Once | ORAL | Status: AC
  Administered 2022-09-20: 600 ug via BUCCAL

## 2022-09-20 NOTE — Anesthesia Procedure Notes (Signed)
Epidural Patient location during procedure: OB Start time: 09/20/2022 4:51 AM End time: 09/20/2022 5:01 AM  Staffing Anesthesiologist: Bethena Midget, MD  Preanesthetic Checklist Completed: patient identified, IV checked, site marked, risks and benefits discussed, surgical consent, monitors and equipment checked, pre-op evaluation and timeout performed  Epidural Patient position: sitting Prep: DuraPrep and site prepped and draped Patient monitoring: continuous pulse ox and blood pressure Approach: midline Location: L3-L4 Injection technique: LOR air  Needle:  Needle type: Tuohy  Needle gauge: 17 G Needle length: 9 cm and 9 Needle insertion depth: 6 cm Catheter type: closed end flexible Catheter size: 19 Gauge Catheter at skin depth: 12 cm Test dose: negative  Assessment Events: blood not aspirated, no cerebrospinal fluid, injection not painful, no injection resistance, no paresthesia and negative IV test

## 2022-09-20 NOTE — Progress Notes (Signed)
Midwife updated on blood glucose and interventions. Patient remains asymptomatic. No new orders.

## 2022-09-20 NOTE — Progress Notes (Signed)
Patient ID: Joanne Huff, female   DOB: July 28, 1990, 32 y.o.   MRN: [id number] LABOR NOTE Joanne Huff is a 32 y.o. G3P2002 at [redacted]w[redacted]d admitted for induction of labor due to BPP 6/8, diabetes mellitus A1DM, and gestational hypertension   Subjective: uncomfortable w/ contractions and requesting epidural. Denies ha, visual changes, ruq/epigastric pain, n/v.    Objective: BP 129/76 (BP Location: Right Arm)   Pulse 62   Temp 98.1 F (36.7 C) (Oral)   Resp 18   LMP 12/23/2021  Total I/O In: 913 [I.V.:913] Out: -   FHR baseline 120 bpm, Variability: moderate, Accelerations:present, Decelerations:  Absent Toco: q 3-5 mins   SVE:   attempted SVE, barely able to reach external os and pt very uncomfortable w/ exam. Vtx  Pitocin @ 16 mu/min  Labs: Lab Results  Component Value Date   WBC 7.8 09/20/2022   HGB 9.8 (L) 09/20/2022   HCT 29.9 (L) 09/20/2022   MCV 87.4 09/20/2022   PLT 269 09/20/2022    Assessment / Plan: IOL d/t BPP 6/8, A1DM, GHTN, foley bulb out at 2300, pit currently at 16, uncomfortable w/ uc's and requesting epidural, has received multiple doses of IV Fentanyl. Still very difficult SVE and pt uncomfortable w/ exam, will wait until gets comfortable w/ epidural, then recheck    Labor: early Fetal Wellbeing:  Category I Pain Control:  requesting epidural Pre-eclampsia: asymptomatic, bp's stable, and labs stable I/D:  GBS neg Anticipated MOD: VBAC  Cheral Marker CNM, WHNP-BC 09/20/2022, 3:42 AM

## 2022-09-20 NOTE — Progress Notes (Signed)
Patient ID: Joanne Huff, female   DOB: 03/10/1991, 32 y.o.   MRN: [id number]  Subjective: -Care assumed of 32 y.o. G3P2002 at [redacted]w[redacted]d who presents for IOL d/t gHTN.  Pregnancy also notable for A1DM and H/O CS. In room to meet acquaintance of patient and family.  Patient reports some rectal pressure with contractions. She states contractions are located in the lower abdominal area.   Objective: BP (!) 130/51   Pulse 66   Temp 97.9 F (36.6 C) (Oral)   Resp 18   LMP 12/23/2021   SpO2 100%  I/O last 3 completed shifts: In: 1800.8 [P.O.:124; I.V.:1676.8] Out: 800 [Urine:800] No intake/output data recorded.  Fetal Monitoring: FHT: 125 bpm, Mod Var, + Variable Decels, +Accels UC: None graphing    Physical Exam: General appearance: alert, well appearing, and in no distress. Chest: normal rate and regular rhythm.  clear to auscultation, no wheezes, rales or rhonchi, symmetric air entry. Abdominal exam: soft, nontender, nondistended, no masses or organomegaly. Extremities: No apparent edema Skin exam: Warm Dry  Vaginal Exam: SVE:   Dilation: 6 Effacement (%): 70 Station: -2 Exam by:: J.Shakirra Buehler, CNM Membranes:AROM x 2.5 hrs Internal Monitors: IUPC inserted  Augmentation/Induction: Pitocin:19mUn/min Cytotec: None  Assessment:  IUP at 33 weeks Cat II FT  IOL d/t gHTN A1DM  Plan: -Discussed IUPC r/b, prior to insertion, including increased risk of infection and ability to adequately monitor quantity and strength of contractions. -Patient agreeable to IUPC placement. -CBGs normal range. -Discussed possible pitocin break, but will continue to titrate for next 1.5 hours to assess contraction pattern.   -Patient agreeable and without questions.  -Continue other mgmt as ordered   Valma Cava, CNM 09/20/2022, 8:58 AM

## 2022-09-20 NOTE — Plan of Care (Signed)

## 2022-09-20 NOTE — Lactation Note (Signed)
This note was copied from a baby's chart. Lactation Consultation Note  Patient Name: Joanne Huff [id number] Date: 09/20/2022 Age:33 hours Reason for consult: Initial assessment;Early term 37-38.6wks Mom awake resting watching TV. Baby sleeping. Mom stated she tried to BF at 1900 but the baby wasn't interested. Mom stated she BF well after delivery. Mom didn't BF her 1st child but BF her 2nd child now 36 yrs old for 6 months. Newborn feeding habits, STS, I&O, reviewed. Mom encouraged to feed baby 8-12 times/24 hours and with feeding cues.  Encouraged mom to call for assistance as needed and for LC to see latch. Maternal Data Does the patient have breastfeeding experience prior to this delivery?: Yes How long did the patient breastfeed?: BF her last child for 6 months  Feeding    LATCH Score                    Lactation Tools Discussed/Used    Interventions    Discharge    Consult Status Consult Status: Follow-up Date: 09/21/22 Follow-up type: In-patient    Charyl Dancer 09/20/2022, 8:42 PM

## 2022-09-20 NOTE — Progress Notes (Signed)
Patient is interested in tubal ligation. We had a thorough discussion of the procedure, including its risks and benefits. We also discussed other options including vasectomy and LARC. I brought up the risk of regret. We discussed postpartum tubal sterilization and interval tubal sterilization. Patient voices she does want to proceed with the procedure but does not want to perform it right now, she is interested in an interval procedure. Will send message to our scheduler to arrange a surgical consult with one of our laparoscopic surgeons.

## 2022-09-20 NOTE — Progress Notes (Signed)
Patient ID: Joanne Huff, female   DOB: 03/09/1991, 32 y.o.   MRN: [id number] Joanne Huff is a 32 y.o. G3P2002 at [redacted]w[redacted]d admitted for induction of Joanne due to BPP 6/8, diabetes mellitus A1DM, and gestational hypertension   Subjective: comfortable with epidural and denies headache, visual changes, ruq/epigastric pain, n/v  Objective: BP 133/76 (BP Location: Right Arm)   Pulse 75   Temp 98.1 F (36.7 C) (Oral)   Resp 18   LMP 12/23/2021   SpO2 100%  Total I/O In: 1611.4 [P.O.:124; I.V.:1487.4] Out: 500 [Urine:500]  FHR baseline 115 bpm, Variability: moderate, Accelerations:present, Decelerations:  Absent Toco: q 4-7 mins   SVE:   5.5/70/-2 to -3 AROM w/ uc, small amt clear fluid   Pitocin @ 18 mu/min  Labs: Lab Results  Component Value Date   WBC 7.8 09/20/2022   HGB 9.8 (L) 09/20/2022   HCT 29.9 (L) 09/20/2022   MCV 87.4 09/20/2022   PLT 269 09/20/2022   CBG (last 3)  Recent Labs    09/20/22 0041 09/20/22 0510 09/20/22 0554  GLUCAP 84 77 75     Assessment / Plan: IOL d/t BPP 6/8, A1DM, GHTN, s/p foley bulb, pit @ 18, comfortable w/ epidural, now AROM'd  Joanne: early Fetal Wellbeing:  Category I Pain Control:  epidural Pre-eclampsia: asymptomatic, bp's stable, and labs stable I/D:  GBS neg Anticipated MOD: VBAC  Cheral Marker CNM, WHNP-BC 09/20/2022, 6:41 AM

## 2022-09-20 NOTE — Progress Notes (Signed)
Hypoglycemic Event  CBG: 61  Treatment: 8 oz juice/soda  Symptoms: None  Follow-up CBG: Time:0945 CBG Result:70  Possible Reasons for Event: Inadequate meal intake  Comments/MD notified:yes phone [phone]    [facility]

## 2022-09-20 NOTE — Anesthesia Postprocedure Evaluation (Signed)
Anesthesia Post Note  Patient: Joanne Huff  Procedure(s) Performed: AN AD HOC LABOR EPIDURAL     Patient location during evaluation: [facility] Anesthesia Type: Epidural Level of consciousness: awake and alert Pain management: satisfactory to patient Vital Signs Assessment: post-procedure vital signs reviewed and stable Respiratory status: spontaneous breathing and respiratory function stable Cardiovascular status: stable Postop Assessment: adequate PO intake Anesthetic complications: no   No notable events documented.  Last Vitals:  Vitals:   09/20/22 1625 09/20/22 1725  BP: (!) 143/89 (!) 149/75  Pulse: 84 92  Resp: 18   Temp: 37.2 C   SpO2:      Last Pain:  Vitals:   09/20/22 1725  TempSrc:   PainSc: 0-No pain   Pain Goal:                   Molli Hazard

## 2022-09-20 NOTE — Plan of Care (Signed)

## 2022-09-20 NOTE — Anesthesia Preprocedure Evaluation (Signed)
Anesthesia Evaluation  Patient identified by MRN, date of birth, ID band Patient awake    Reviewed: Allergy & Precautions, H&P , NPO status , Patient's Chart, lab work & pertinent test results, reviewed documented beta blocker date and time   Airway Mallampati: II  TM Distance: >3 FB Neck ROM: full    Dental no notable dental hx. (+) Teeth Intact, Dental Advisory Given   Pulmonary neg pulmonary ROS, former smoker   Pulmonary exam normal breath sounds clear to auscultation       Cardiovascular hypertension, negative cardio ROS Normal cardiovascular exam Rhythm:regular Rate:Normal     Neuro/Psych  Headaches negative neurological ROS  negative psych ROS   GI/Hepatic negative GI ROS, Neg liver ROS,,,  Endo/Other  diabetes, Gestational    Renal/GU negative Renal ROS  negative genitourinary   Musculoskeletal   Abdominal  (+) + obese  Peds  Hematology  (+) Blood dyscrasia, anemia   Anesthesia Other Findings   Reproductive/Obstetrics (+) Pregnancy                             Anesthesia Physical Anesthesia Plan  ASA: 3  Anesthesia Plan: Epidural   Post-op Pain Management: Minimal or no pain anticipated   Induction: Intravenous  PONV Risk Score and Plan: 2  Airway Management Planned: Natural Airway  Additional Equipment: None  Intra-op Plan:   Post-operative Plan:   Informed Consent: I have reviewed the patients History and Physical, chart, labs and discussed the procedure including the risks, benefits and alternatives for the proposed anesthesia with the patient or authorized representative who has indicated his/her understanding and acceptance.       Plan Discussed with: Anesthesiologist and CRNA  Anesthesia Plan Comments:        Anesthesia Quick Evaluation

## 2022-09-20 NOTE — Discharge Summary (Addendum)
Postpartum Discharge Summary     Patient Name: Joanne Huff DOB: 03-Feb-1991 MRN: [id number]  Date of admission: 09/19/2022 Delivery date:09/20/2022  Delivering provider: Gerrit Heck  Date of discharge: 09/21/2022  Admitting diagnosis: GDM (gestational diabetes mellitus) [O24.419] Intrauterine pregnancy: [redacted]w[redacted]d     Secondary diagnosis:  Principal Problem:   VBAC (vaginal birth after Cesarean) Active Problems:   GDM (gestational diabetes mellitus)   Gestational hypertension w/o significant proteinuria in 3rd trimester  Additional problems: Postpartum hypertension    Discharge diagnosis: VBAC                                              Post partum procedures: none Augmentation: AROM and Pitocin Complications: None  Hospital course: Induction of Labor With Vaginal Delivery   32 y.o. yo G3P2002 at [redacted]w[redacted]d was admitted to the hospital 09/19/2022 for induction of labor.  Indication for induction: Gestational hypertension and A1 DM.  Patient had an labor course complicated as above.  Notable also for TOLAC.  Membrane Rupture Time/Date: 6:40 AM ,09/20/2022   Delivery Method:VBAC, Spontaneous  Episiotomy: None  Lacerations:  2nd degree;Perineal  Details of delivery can be found in separate delivery note.  Patient had a postpartum course complicated by elevated BPs immediately PP. Patient is discharged home 09/21/22.  Newborn Data: Birth date:09/20/2022  Birth time:1:54 PM  Gender:Female  Living status:Living  Apgars:9 ,9  Weight:3260 g   Magnesium Sulfate received: No BMZ received: No Rhophylac:N/A MMR:No T-DaP:Given prenatally Flu: Yes Transfusion:No  Physical exam  Vitals:   09/20/22 1725 09/20/22 2200 09/21/22 0100 09/21/22 0632  BP: (!) 149/75 138/72 135/71 131/68  Pulse: 92 82 68 69  Resp:  18 18 18   Temp:  98.3 F (36.8 C) 98 F (36.7 C) 97.9 F (36.6 C)  TempSrc:  Oral Oral Oral  SpO2:  100% 99% 100%   General: alert, cooperative, and no distress Lochia:  appropriate Uterine Fundus: firm Incision: N/A DVT Evaluation: No evidence of DVT seen on physical exam. Negative Homan's sign. No cords or calf tenderness. No significant calf/ankle edema. Labs: Lab Results  Component Value Date   WBC 8.5 09/21/2022   HGB 8.9 (L) 09/21/2022   HCT 26.3 (L) 09/21/2022   MCV 88.6 09/21/2022   PLT 230 09/21/2022      Latest Ref Rng & Units 09/19/2022    9:58 AM  CMP  Glucose 70 - 99 mg/dL 81   BUN 6 - 20 mg/dL 6   Creatinine 8.46 - 9.62 mg/dL 9.52   Sodium 841 - 324 mmol/L 136   Potassium 3.5 - 5.1 mmol/L 3.6   Chloride 98 - 111 mmol/L 105   CO2 22 - 32 mmol/L 21   Calcium 8.9 - 10.3 mg/dL 8.7   Total Protein 6.5 - 8.1 g/dL 6.0   Total Bilirubin 0.3 - 1.2 mg/dL 0.4   Alkaline Phos 38 - 126 U/L 70   AST 15 - 41 U/L 17   ALT 0 - 44 U/L 17   CBG this AM = 82  Edinburgh Score:    09/20/2022    5:25 PM  Edinburgh Postnatal Depression Scale Screening Tool  I have been able to laugh and see the funny side of things. 0  I have looked forward with enjoyment to things. 2  I have blamed myself unnecessarily when things went wrong. 1  I have been anxious or worried for no good reason. 2  I have felt scared or panicky for no good reason. 0  Things have been getting on top of me. 1  I have been so unhappy that I have had difficulty sleeping. 1  I have felt sad or miserable. 1  I have been so unhappy that I have been crying. 1  The thought of harming myself has occurred to me. 0  Edinburgh Postnatal Depression Scale Total 9     After visit meds:  Allergies as of 09/21/2022   No Known Allergies      Medication List     STOP taking these medications    Accu-Chek Guide test strip Generic drug: glucose blood   Accu-Chek Guide w/Device Kit   Accu-Chek Softclix Lancets lancets   aspirin EC 81 MG tablet   Blood Pressure Kit Devi       TAKE these medications    docusate sodium 100 MG capsule Commonly known as: Colace Take 1 capsule  (100 mg total) by mouth 2 (two) times daily.   furosemide 20 MG tablet Commonly known as: LASIX Take 1 tablet (20 mg total) by mouth daily.   Gojji Weight Scale Misc 1 Device by Does not apply route as needed.   ibuprofen 600 MG tablet Commonly known as: ADVIL Take 1 tablet (600 mg total) by mouth every 6 (six) hours.   NIFEdipine 30 MG 24 hr tablet Commonly known as: Procardia XL Take 1 tablet (30 mg total) by mouth daily.   PRENATAL PO Take by mouth.   senna 8.6 MG Tabs tablet Commonly known as: SENOKOT Take 1 tablet (8.6 mg total) by mouth daily as needed for mild constipation.   valACYclovir 500 MG tablet Commonly known as: VALTREX Take 1 tablet (500 mg total) by mouth 2 (two) times daily.         Discharge home in stable condition Infant Feeding: Bottle and Breast Infant Disposition:home with mother Discharge instruction: per After Visit Summary and Postpartum booklet. Activity: Advance as tolerated. Pelvic rest for 6 weeks.  Diet: routine diet Future Appointments: Future Appointments  Date Time Provider Department Center  09/28/2022  1:30 PM [facility] NURSE [facility]  10/27/2022  8:35 AM Warden Fillers, MD [facility]   Follow up Visit:  Follow-up Information     Center for [facility]. Schedule an appointment as soon as possible for a visit.   Specialty: Obstetrics and Gynecology Why: blood pressure recheck Contact information: [address] [place] [address] [phone]        Center for [employer] [facility] for Women. Go on 10/27/2022.   Specialty: Obstetrics and Gynecology Why: postpartum visit Contact information: [address] [place] [address] [phone]               Message sent 09/20/2022   Please schedule this patient for a In person postpartum visit in 6 weeks (already scheduled) with the following provider: Any  provider. Additional Postpartum F/U:BP check 1 week  High risk pregnancy complicated by: GDM, HTN, and H/O C/S Delivery mode:  VBAC, Spontaneous  Anticipated Birth Control:  Plans Interval BTL   09/21/2022 Raelyn Mora, CNM

## 2022-09-21 ENCOUNTER — Other Ambulatory Visit (HOSPITAL_COMMUNITY): Payer: Self-pay

## 2022-09-21 LAB — CBC
HCT: 26.3 % — ABNORMAL LOW (ref 36.0–46.0)
Hemoglobin: 8.9 g/dL — ABNORMAL LOW (ref 12.0–15.0)
MCH: 30 pg (ref 26.0–34.0)
MCHC: 33.8 g/dL (ref 30.0–36.0)
MCV: 88.6 fL (ref 80.0–100.0)
Platelets: 230 10*3/uL (ref 150–400)
RBC: 2.97 MIL/uL — ABNORMAL LOW (ref 3.87–5.11)
RDW: 14.1 % (ref 11.5–15.5)
WBC: 8.5 10*3/uL (ref 4.0–10.5)
nRBC: 0 % (ref 0.0–0.2)

## 2022-09-21 LAB — GLUCOSE, CAPILLARY: Glucose-Capillary: 82 mg/dL (ref 70–99)

## 2022-09-21 MED ORDER — FUROSEMIDE 20 MG PO TABS
20.0000 mg | ORAL_TABLET | Freq: Every day | ORAL | 0 refills | Status: DC
  Filled 2022-09-21: qty 5, 5d supply, fill #0

## 2022-09-21 MED ORDER — NIFEDIPINE ER 30 MG PO TB24
30.0000 mg | ORAL_TABLET | Freq: Every day | ORAL | 2 refills | Status: DC
  Filled 2022-09-21 – 2022-10-17 (×2): qty 30, 30d supply, fill #0

## 2022-09-21 MED ORDER — IBUPROFEN 600 MG PO TABS
600.0000 mg | ORAL_TABLET | Freq: Four times a day (QID) | ORAL | 0 refills | Status: AC
  Filled 2022-09-21: qty 60, 15d supply, fill #0

## 2022-09-21 NOTE — Lactation Note (Signed)
This note was copied from a baby's chart. Lactation Consultation Note  Patient Name: Joanne Huff [id number] Date: 09/21/2022 Age:33 hours Reason for consult: Follow-up assessment;Infant weight loss (3 % weight loss,per mom plans to go home later today is every thing is ok with the baby.) Per mom baby latches well and plans to breast and formula.  LC encouraged mom to offer both breast and if baby is still hungry offer the 2nd breast, if satisfied hold off on the formula. Since the weight loss is ok , if supplementing after BF keep it 30 ml or below.  Mom to call for latch assessment.   Since mom and baby possibly going home later today LC reviewed BF D/C teaching and the [facility] resources.     Maternal Data    Feeding Mother's Current Feeding Choice: Breast Milk and Formula Nipple Type: Extra Slow Flow  LATCH Score - Latch score 9 in L/D .    Lactation Tools Discussed/Used    Interventions Interventions: Breast feeding basics reviewed;Education  Discharge Discharge Education: Engorgement and breast care;Warning signs for feeding baby  Consult Status Consult Status: Follow-up Date: 09/21/22 Follow-up type: In-patient    Joanne Huff 09/21/2022, 10:59 AM

## 2022-09-21 NOTE — Lactation Note (Signed)
This note was copied from a baby's chart. Lactation Consultation Note  Patient Name: Joanne Huff [id number] Date: 09/21/2022 Age:33 hours Reason for consult: Follow-up assessment;Early term 37-38.6wks;Infant weight loss 2nd LC visit.  As LC entered the room mom attempting to latch.  LC recommended switching to a football position to obtain a deeper latch and better support.   LC also recommended steps for latching ( see below under education).  Baby was able to latch with depth and swallows, for 7 mins and released. Baby had been fed 30 ml of formula at 10 am and probably isn't to hungry.  1st visit LC had reviewed BF D/C teaching since mom desires to be D/C today early.  LC asked the [facility] Caryl Asp to provide coconut oil . Mom aware to use a dab to soften tissue and to help dryness.    Maternal Data Has patient been taught Hand Expression?: Yes (few drops)  Feeding Mother's Current Feeding Choice: Breast Milk and Formula Nipple Type: Extra Slow Flow  LATCH Score Latch: Repeated attempts needed to sustain latch, nipple held in mouth throughout feeding, stimulation needed to elicit sucking reflex.  Audible Swallowing: A few with stimulation  Type of Nipple: Everted at rest and after stimulation (areola edema , improved with reverse pressure)  Comfort (Breast/Nipple): Soft / non-tender  Hold (Positioning): Assistance needed to correctly position infant at breast and maintain latch.  LATCH Score: 7   Lactation Tools Discussed/Used Tools: Shells;Pump;Flanges Flange Size: 21 Breast pump type: Manual Pump Education: Milk Storage;Setup, frequency, and cleaning Reason for Pumping: LC recommended due to some areola edema - steps for latching to achieve a deeper consistent latch. Breast massage , hand express, pre- pump with the hand pump 10 -20 strokes and reverse pressure , latch with support, Foot ball for now recommended to achieve a deeper  latch.  Interventions Interventions: Breast feeding basics reviewed;Assisted with latch;Skin to skin;Breast massage;Hand express;Reverse pressure;Breast compression;Adjust position;Support pillows;Position options;Coconut oil;Hand pump;Education;LC Services brochure  Discharge Discharge Education: Engorgement and breast care;Warning signs for feeding baby Pump: Hands Free;Manual;Personal  Consult Status Consult Status: Complete Date: 09/21/22 Follow-up type: In-patient    Matilde Sprang Everley Evora 09/21/2022, 1:26 PM

## 2022-09-27 ENCOUNTER — Encounter: Payer: Self-pay | Admitting: *Deleted

## 2022-09-28 ENCOUNTER — Ambulatory Visit (INDEPENDENT_AMBULATORY_CARE_PROVIDER_SITE_OTHER): Payer: Medicaid Other | Admitting: *Deleted

## 2022-09-28 ENCOUNTER — Other Ambulatory Visit: Payer: Self-pay

## 2022-09-28 VITALS — BP 134/84 | HR 76 | Ht 70.0 in | Wt 265.8 lb

## 2022-09-28 DIAGNOSIS — Z0130 Encounter for examination of blood pressure without abnormal findings: Secondary | ICD-10-CM

## 2022-09-28 NOTE — Progress Notes (Signed)
Pt here for BP check following vaginal delivery on 09/20/22. Her pregnancy was complicated by gHTN. Pt states that she is taking Nifedipine as prescribed. BP - 134/84, P - 76. She denies H/A or visual disturbances. Pt was instructed to go to [facility] if these sx should develop. She was advised to continue Nifedipine as prescribed and to keep PP appt on 7/12. Pt voiced understanding of all information and instructions given.

## 2022-09-29 NOTE — Progress Notes (Signed)
Patient was assessed and managed by nursing staff during this encounter. I have reviewed the chart and agree with the documentation and plan. I have also made any necessary editorial changes.  Sequoyah Bing, MD 09/29/2022 12:06 PM

## 2022-10-17 ENCOUNTER — Other Ambulatory Visit (HOSPITAL_COMMUNITY): Payer: Self-pay

## 2022-10-27 ENCOUNTER — Other Ambulatory Visit: Payer: Self-pay

## 2022-10-27 ENCOUNTER — Encounter: Payer: Self-pay | Admitting: Obstetrics and Gynecology

## 2022-10-27 ENCOUNTER — Ambulatory Visit (INDEPENDENT_AMBULATORY_CARE_PROVIDER_SITE_OTHER): Payer: Medicaid Other | Admitting: Obstetrics and Gynecology

## 2022-10-27 ENCOUNTER — Encounter: Payer: Self-pay | Admitting: Family Medicine

## 2022-10-27 DIAGNOSIS — Z30017 Encounter for initial prescription of implantable subdermal contraceptive: Secondary | ICD-10-CM

## 2022-10-27 MED ORDER — ETONOGESTREL 68 MG ~~LOC~~ IMPL
68.0000 mg | DRUG_IMPLANT | Freq: Once | SUBCUTANEOUS | Status: AC
Start: 2022-10-27 — End: 2022-10-27
  Administered 2022-10-27: 68 mg via SUBCUTANEOUS

## 2022-10-27 NOTE — Progress Notes (Signed)
     GYNECOLOGY OFFICE PROCEDURE NOTE  Joanne Huff is a 32 y.o. [id number] here for  Nexplanon insertion.  Last pap smear was on 2/24 and was normal.  No other gynecologic concerns.  Nexplanon Insertion Procedure Patient identified, informed consent performed, consent signed.   Patient does understand that irregular bleeding is a very common side effect of this medication. She was advised to have backup contraception for one week after placement. Pregnancy test in clinic today was negative.  Appropriate time out taken.  Patient's right arm was prepped and draped in the usual sterile fashion. The ruler used to measure and mark insertion area.  Patient was prepped with alcohol swab and then injected with 3 ml of 1% lidocaine.  She was prepped with betadine, Nexplanon removed from packaging,  Device confirmed in needle, then inserted full length of needle and withdrawn per handbook instructions. Nexplanon was able to palpated in the patient's arm; patient palpated the insert herself. There was minimal blood loss.  Patient insertion site covered with guaze and a pressure bandage to reduce any bruising.  The patient tolerated the procedure well and was given post procedure instructions.     Mariel Aloe, MD, FACOG Obstetrician & Gynecologist, [facility] for [facility], [facility]

## 2022-10-27 NOTE — Progress Notes (Signed)
Post Partum Visit Note  Joanne Huff is a 32 y.o. [id number] female who presents for a postpartum visit. She is 5 weeks postpartum following a vaginal birth after cesarean section.  I have fully reviewed the prenatal and intrapartum course. The delivery was at 37 gestational weeks.  Anesthesia: epidural. Postpartum course has been uncomplicated. Baby is doing well. Baby is feeding by both breast and bottle - Similac Advance. Bleeding no bleeding. Bowel function is normal. Bladder function is normal. Patient is not sexually active. Contraception method is none. Postpartum depression screening: negative.   Upstream - 10/27/22 0838       Pregnancy Intention Screening   Does the patient want to become pregnant in the next year? No    Does the patient's partner want to become pregnant in the next year? No    Would the patient like to discuss contraceptive options today? Yes      Contraception Wrap Up   Current Method Abstinence    End Method Hormonal Implant            The pregnancy intention screening data noted above was reviewed. Potential methods of contraception were discussed. The patient elected to proceed with nexplanon   Edinburgh Postnatal Depression Scale - 10/27/22 0839       Edinburgh Postnatal Depression Scale:  In the Past 7 Days   I have been able to laugh and see the funny side of things. 0    I have looked forward with enjoyment to things. 0    I have blamed myself unnecessarily when things went wrong. 0    I have been anxious or worried for no good reason. 0    I have felt scared or panicky for no good reason. 0    Things have been getting on top of me. 0    I have been so unhappy that I have had difficulty sleeping. 0    I have felt sad or miserable. 0    I have been so unhappy that I have been crying. 0    The thought of harming myself has occurred to me. 0    Edinburgh Postnatal Depression Scale Total 0             Health Maintenance Due  Topic Date  Due   COVID-19 Vaccine (1) Never done    The following portions of the patient's history were reviewed and updated as appropriate: allergies, current medications, past family history, past medical history, past social history, past surgical history, and problem list.  Review of Systems Pertinent items are noted in HPI.  Objective:  BP 128/76   Pulse 69   Wt 277 lb 1.6 oz (125.7 kg)   LMP 12/23/2021   BMI 39.76 kg/m    General:  alert, cooperative, and moderately obese   Breasts:  not indicated  Lungs: clear to auscultation bilaterally  Heart:  regular rate and rhythm  Abdomen: soft, non-tender; bowel sounds normal; no masses,  no organomegaly   Wound N/a  GU exam:  not indicated       Assessment:    Encounter for postpartum care  Normal  postpartum exam.   Plan:   Essential components of care per ACOG recommendations:  1.  Mood and well being: Patient with negative depression screening today. Reviewed local resources for support.  - Patient tobacco use? No.   - hx of drug use? No.    2. Infant care and feeding:  -Patient currently breastmilk  feeding? Yes. Reviewed importance of draining breast regularly to support lactation.  -Social determinants of health (SDOH) reviewed in EPIC. No concerns.  3. Sexuality, contraception and birth spacing - Patient does not want a pregnancy in the next year.  Desired family size is 3 children.  - Reviewed reproductive life planning. Reviewed contraceptive methods based on pt preferences and effectiveness.  Patient desired Hormonal Implant today.   - Discussed birth spacing of 18 months  4. Sleep and fatigue -Encouraged family/partner/community support of 4 hrs of uninterrupted sleep to help with mood and fatigue  5. Physical Recovery  - Discussed patients delivery and complications. She describes her labor as good. - Patient had a Vaginal, no problems at delivery. Patient had a 2nd degree laceration. Perineal healing reviewed.  Patient expressed understanding - Patient has urinary incontinence? No. - Patient is safe to resume physical and sexual activity  6.  Health Maintenance - HM due items addressed Yes - Last pap smear  Diagnosis  Date Value Ref Range Status  05/25/2022   Final   - Negative for intraepithelial lesion or malignancy (NILM)   Pap smear not done at today's visit.  -Breast Cancer screening indicated? No.   7. Chronic Disease/Pregnancy Condition follow up: Hypertension and Gestational Diabetes  - PCP follow up F/u in 1 year  Warden Fillers, MD [facility] [employer], [facility]

## 2022-10-27 NOTE — Addendum Note (Signed)
Addended by: Cinda Quest A on: 10/27/2022 09:44 AM   Modules accepted: Orders

## 2022-11-03 ENCOUNTER — Other Ambulatory Visit: Payer: Self-pay | Admitting: Lactation Services

## 2022-11-03 DIAGNOSIS — O133 Gestational [pregnancy-induced] hypertension without significant proteinuria, third trimester: Secondary | ICD-10-CM

## 2022-11-03 DIAGNOSIS — O24410 Gestational diabetes mellitus in pregnancy, diet controlled: Secondary | ICD-10-CM

## 2022-11-06 ENCOUNTER — Other Ambulatory Visit: Payer: Medicaid Other

## 2023-12-13 ENCOUNTER — Ambulatory Visit: Admitting: Obstetrics and Gynecology

## 2023-12-13 ENCOUNTER — Encounter: Payer: Self-pay | Admitting: Obstetrics and Gynecology

## 2023-12-13 VITALS — BP 148/89 | HR 65 | Wt 285.0 lb

## 2023-12-13 DIAGNOSIS — Z3046 Encounter for surveillance of implantable subdermal contraceptive: Secondary | ICD-10-CM | POA: Diagnosis not present

## 2023-12-13 NOTE — Progress Notes (Signed)
     GYNECOLOGY OFFICE PROCEDURE NOTE  Joanne Huff is a 33 y.o. [phone] here for Nexplanon  removal.  Pt is concerned about 20 pound weight gain since placement and menstrual irregularities.  Last pap smear was on 2/24 and was normal.  No other gynecologic concerns.   Nexplanon  Removal Patient identified, informed consent performed, consent signed.   Appropriate time out taken. Nexplanon  site identified.  Area prepped in usual sterile fashon. One ml of 1% lidocaine  was used to anesthetize the area at the distal end of the implant. A small stab incision was made right beside the implant on the distal portion.  The Nexplanon  rod was grasped using hemostats and removed without difficulty.  There was minimal blood loss. There were no complications.  3 ml of 1% lidocaine  was injected around the incision for post-procedure analgesia.  Steri-strips were applied over the small incision.  A pressure bandage was applied to reduce any bruising.  The patient tolerated the procedure well and was given post procedure instructions.  Patient is planning to use abstinence for contraception.   Jerilynn Buddle, MD, FACOG Obstetrician & Gynecologist, [facility] for [facility], [facility]
